# Patient Record
Sex: Female | Born: 1970 | Race: White | Hispanic: No | Marital: Single | Smoking: Never smoker
Health system: Southern US, Community
[De-identification: ages and names within clinical notes are randomized; demographics above are authoritative.]

## PROBLEM LIST (undated history)

## (undated) DIAGNOSIS — I10 Essential (primary) hypertension: Secondary | ICD-10-CM

## (undated) DIAGNOSIS — K219 Gastro-esophageal reflux disease without esophagitis: Secondary | ICD-10-CM

## (undated) DIAGNOSIS — E119 Type 2 diabetes mellitus without complications: Secondary | ICD-10-CM

## (undated) DIAGNOSIS — E1169 Type 2 diabetes mellitus with other specified complication: Secondary | ICD-10-CM

## (undated) DIAGNOSIS — F339 Major depressive disorder, recurrent, unspecified: Secondary | ICD-10-CM

## (undated) HISTORY — PX: ANKLE SURGERY: SHX546

## (undated) HISTORY — PX: TUBAL LIGATION: SHX77

## (undated) HISTORY — PX: WRIST SURGERY: SHX841

---

## 2016-05-17 ENCOUNTER — Ambulatory Visit: Attending: Family Medicine

## 2016-05-17 ENCOUNTER — Ambulatory Visit: Admit: 2016-05-17 | Discharge: 2016-05-17 | Payer: BLUE CROSS/BLUE SHIELD | Attending: Family Medicine

## 2016-05-17 ENCOUNTER — Telehealth

## 2016-05-17 DIAGNOSIS — R350 Frequency of micturition: Secondary | ICD-10-CM

## 2016-05-17 LAB — AMB POC COMPLETE CBC,AUTOMATED ENTER
ABS. GRANS (POC): 4 10*3/uL (ref 1.5–6.8)
ABS. LYMPHS (POC): 1.7 10*3/uL (ref 1.2–3.2)
ABS. MONOS (POC): 0.2 10*3/uL — AB (ref 0.3–0.8)
GRANULOCYTES (POC): 65.2 % (ref 43.0–76.0)
HCT (POC): 36.8 % (ref 35–50)
HGB (POC): 12 g/dL (ref 11–16.5)
LYMPHOCYTES (POC): 30.2 % (ref 17.0–48.0)
MCH (POC): 26.1 pg — AB (ref 26.5–33.5)
MCHC (POC): 32.8 g/dL (ref 31.5–35)
MCV (POC): 80 fL (ref 80–97)
MONOCYTES (POC): 4.6 % (ref 4.0–10.0)
MPV (POC): 7.4 fL (ref 6.5–11.0)
PLATELET (POC): 257 10*3/uL (ref 150–450)
RBC (POC): 4.61 10*6/uL (ref 3.8–5.8)
RDW (POC): 14.6 % (ref 10.0–15.0)
WBC (POC): 5.9 10*3/uL (ref 3.5–10)

## 2016-05-17 LAB — AMB POC HEMOGLOBIN A1C: Hemoglobin A1c (POC): 11.1 %

## 2016-05-17 MED ORDER — HYDROCHLOROTHIAZIDE 25 MG TAB
25 mg | ORAL_TABLET | Freq: Every day | ORAL | 0 refills | Status: DC
Start: 2016-05-17 — End: 2016-08-31

## 2016-05-17 MED ORDER — METFORMIN SR 500 MG 24 HR TABLET
500 mg | ORAL_TABLET | Freq: Every day | ORAL | 1 refills | Status: DC
Start: 2016-05-17 — End: 2016-08-31

## 2016-05-17 MED ORDER — DULOXETINE 60 MG CAP, DELAYED RELEASE
60 mg | ORAL_CAPSULE | Freq: Every day | ORAL | 0 refills | Status: DC
Start: 2016-05-17 — End: 2016-08-31

## 2016-05-17 MED ORDER — METOPROLOL TARTRATE 25 MG TAB
25 mg | ORAL_TABLET | Freq: Two times a day (BID) | ORAL | 0 refills | Status: DC
Start: 2016-05-17 — End: 2016-08-31

## 2016-05-17 MED ORDER — OMEPRAZOLE 20 MG TAB, DELAYED RELEASE
20 mg | ORAL_TABLET | Freq: Every day | ORAL | 0 refills | Status: DC
Start: 2016-05-17 — End: 2016-08-31

## 2016-05-17 NOTE — Assessment & Plan Note (Signed)
Problem and/or Symptoms are new to provider. Will have patient follow up as directed and make the following plan for further evaluation and/or treatlment:  Stable: Continue with Cymbalta 60 mg daily for now

## 2016-05-17 NOTE — Patient Instructions (Signed)
[address] [facility] is a [facility] which is a comprehensive non-surgical option for a structured approach to weight loss, including personalized instruction regarding diet and exercise with regular follow up. The program is usually 12 weeks long and can be contacted directly at: [phone]  I am checking one or more labs today related to your health concerns and if any results require a change in your treatment regimen you will be notified right away.  Continue taking all the medications on this list as directed; this list is current and please discontinue any medications not on this list. Please call the office or use "My Chart" online to request medication refills prior to running out.  It was a pleasure to see and care for you this visit; I look forward to seeing you next time.

## 2016-05-17 NOTE — Assessment & Plan Note (Signed)
Problem and/or Symptoms are new to provider. Will have patient follow up as directed and make the following plan for further evaluation and/or treatlment:  Stable: Continue with OTC meds as needed

## 2016-05-17 NOTE — Progress Notes (Signed)
[name] MD  [facility]  [address]  [place], [place] [address]  [phone]  Visit Date  05/17/2016    Patient Info  Kim Wallace  45 y.o.female  DOB: [birth year]  MRN: [id number]    Subjective    Chief Complaint   Patient presents with   ??? Hypertension   ??? Establish Care       HPI    45 yo CF here establish care as new patient and to discuss chronic health problems including: Allergic rhinitis, elevated BMI, depression with anxiety, acid reflux, and hypertension.  Patient feels that her current problems are well-controlled on her current regimen and has no acute complaints or concerns today except for chronic fatigue/malaise, urinary frequency, and strong family history of type 2 DM.     Review of Systems   Constitutional: Positive for fatigue. Negative for fever.   HENT: Negative for congestion and sore throat.    Eyes: Negative for pain and visual disturbance.   Respiratory: Negative for cough and shortness of breath.    Cardiovascular: Negative for chest pain and palpitations.   Gastrointestinal: Negative for abdominal pain, constipation, diarrhea and nausea.   Endocrine: Positive for polyuria. Negative for polydipsia and polyphagia.   Genitourinary: Positive for frequency. Negative for difficulty urinating and menstrual problem.   Musculoskeletal: Negative for arthralgias and myalgias.   Skin: Negative for color change and rash.   Allergic/Immunologic: Negative for environmental allergies and food allergies.   Neurological: Negative for weakness and headaches.   Hematological: Does not bruise/bleed easily.   Psychiatric/Behavioral: Negative for dysphoric mood and sleep disturbance.       Objective    Vitals:    05/17/16 1111   BP: 120/80   Pulse: 64   Temp: 99.1 ??F (37.3 ??C)   TempSrc: Oral   Weight: 241 lb (109.3 kg)   Height: 5' 7.5" (1.715 m)     Body mass index is 37.19 kg/(m^2).    Physical Exam   Constitutional: She appears well-developed and well-nourished.   HENT:    Head: Normocephalic and atraumatic.   Eyes: Conjunctivae and EOM are normal.   Neck: Normal range of motion and phonation normal.   Cardiovascular: Normal rate, regular rhythm, normal heart sounds and intact distal pulses.    Pulmonary/Chest: Effort normal and breath sounds normal. No respiratory distress.   Abdominal: Soft. Normal appearance.   Musculoskeletal: She exhibits no edema or deformity.   Neurological: She is alert. She displays no tremor. Coordination and gait normal.   Skin: Skin is dry. No rash noted.   Psychiatric: She has a normal mood and affect. Her behavior is normal.   Vitals reviewed.      Assessment/Plan    Problem List Items Addressed This Visit        Circulatory    HTN (hypertension)     Problem and/or Symptoms are new to provider. Will have patient follow up as directed and make the following plan for further evaluation and/or treatlment:  Stable: Continue with metoprolol and hydrochlorothiazide for now         Relevant Medications    hydroCHLOROthiazide (HYDRODIURIL) 25 mg tablet    metoprolol tartrate (LOPRESSOR) 25 mg tablet    Other Relevant Orders    METABOLIC PANEL, COMPREHENSIVE       Digestive    GERD (gastroesophageal reflux disease)     Problem and/or Symptoms are new to provider. Will have patient follow up as directed and make the following  plan for further evaluation and/or treatlment:  Stable: Continue with Prilosec 20 mg daily for now         Relevant Medications    Omeprazole delayed release (PRILOSEC D/R) 20 mg tablet       Respiratory    AR (allergic rhinitis)     Problem and/or Symptoms are new to provider. Will have patient follow up as directed and make the following plan for further evaluation and/or treatlment:  Stable: Continue with OTC meds as needed            Other    Annual physical exam     Problem not addressed today but diagnosis added to order future lab.           Relevant Orders    AMB POC COMPLETE CBC,AUTOMATED ENTER (Completed)    LIPID PANEL     BMI 37.0-37.9, adult     Discussed with patient proper diet and exercise to include: correct percentage of protein, fat, and carbohydrates for each meal with the focus on a low carbohydrate diet; also regular cardiovascular and strength training exercise most days of the week. All questions answered and will follow up weight and adherence to lifestyle modifications at next visit.  Also gave patient contact information for the physician lead weight loss program through [facility].         Relevant Medications    hydroCHLOROthiazide (HYDRODIURIL) 25 mg tablet    Depression with anxiety     Problem and/or Symptoms are new to provider. Will have patient follow up as directed and make the following plan for further evaluation and/or treatlment:  Stable: Continue with Cymbalta 60 mg daily for now         Relevant Medications    DULoxetine (CYMBALTA) 60 mg capsule      Other Visit Diagnoses     Urinary frequency    -  Primary    New problem: Along with chronic fatigue and strong family history of type 2 diabetes, will check A1c today to further evaluate for new diagnosis.    Relevant Orders    AMB POC HEMOGLOBIN A1C (Completed)    Malaise and fatigue        Relevant Medications    DULoxetine (CYMBALTA) 60 mg capsule    Other Relevant Orders    AMB POC HEMOGLOBIN A1C (Completed)    VITAMIN D, 25 HYDROXY    TSH 3RD GENERATION    Family history of diabetes mellitus (DM)        Relevant Orders    AMB POC HEMOGLOBIN A1C (Completed)    PR MOST RECENT HEMOGLOBIN A1C LEVEL > 9.0%          The following portions of the patient's history were reviewed and updated as appropriate: problem list, current medications, past medical history, past surgical history, past social history, family history, and allergies.    Patient Active Problem List   Diagnosis Code   ??? BMI 37.0-37.9, adult Z68.37   ??? HTN (hypertension) I10   ??? GERD (gastroesophageal reflux disease) K21.9   ??? Depression with anxiety F41.8    ??? Annual physical exam Z00.00   ??? Gastroparesis K31.84   ??? AR (allergic rhinitis) J30.9       Current Outpatient Prescriptions:   ???  DULoxetine (CYMBALTA) 60 mg capsule, Take 1 Cap by mouth daily., Disp: 92 Cap, Rfl: 0  ???  hydroCHLOROthiazide (HYDRODIURIL) 25 mg tablet, Take 1 Tab by mouth daily., Disp: 92 Tab,  Rfl: 0  ???  metoprolol tartrate (LOPRESSOR) 25 mg tablet, Take 1 Tab by mouth two (2) times a day., Disp: 184 Tab, Rfl: 0  ???  Omeprazole delayed release (PRILOSEC D/R) 20 mg tablet, Take 1 Tab by mouth daily., Disp: 92 Tab, Rfl: 0  Past Medical History:   Diagnosis Date   ??? Acid reflux    ??? Anxiety    ??? Depression    ??? Hypertension    ??? Sleep apnea      Past Surgical History:   Procedure Laterality Date   ??? HX ANKLE FRACTURE TX      ankle repair (has pins and screws) left ankle    ??? HX PELVIC FRACTURE TX     ??? HX TUBAL LIGATION      20 years ago    ??? HX WRIST FRACTURE TX      patient ripped tendons and had it repaired. (Left wrist)      Social History     Social History   ??? Marital status: SINGLE     Spouse name: N/A   ??? Number of children: N/A   ??? Years of education: N/A     Occupational History   ??? Not on file.     Social History Main Topics   ??? Smoking status: Never Smoker   ??? Smokeless tobacco: Not on file   ??? Alcohol use No   ??? Drug use: Not on file   ??? Sexual activity: Not on file     Other Topics Concern   ??? Not on file     Social History Narrative   ??? No narrative on file     Family History   Problem Relation Age of Onset   ??? Heart Disease Mother    ??? Diabetes Mother    ??? Hypertension Mother    ??? Diabetes Father    ??? Hypertension Father    ??? Heart Disease Father    ??? Migraines Father    ??? High Cholesterol Father    ??? Diabetes Sister    ??? Diabetes Sister      Allergies   Allergen Reactions   ??? Sulfa (Sulfonamide Antibiotics) Rash       This note will not be viewable in MyChart.

## 2016-05-17 NOTE — Assessment & Plan Note (Signed)
Problem not addressed today but diagnosis added to order future lab.

## 2016-05-17 NOTE — Assessment & Plan Note (Signed)
Problem and/or Symptoms are new to provider. Will have patient follow up as directed and make the following plan for further evaluation and/or treatlment:  Stable: Continue with metoprolol and hydrochlorothiazide for now

## 2016-05-17 NOTE — Telephone Encounter (Signed)
Please notify patient that she has a new diagnosis of type 2 diabetes with a recent A1c of 11.  I am sending a prescription for her to start a new medication called metformin, gradually increasing the dose over the next several weeks.  We will plan to recheck her A1c at next scheduled office visit.

## 2016-05-17 NOTE — Assessment & Plan Note (Addendum)
Problem and/or Symptoms are new to provider. Will have patient follow up as directed and make the following plan for further evaluation and/or treatlment:  Stable: Continue with Prilosec 20 mg daily for now

## 2016-05-17 NOTE — Assessment & Plan Note (Signed)
Discussed with patient proper diet and exercise to include: correct percentage of protein, fat, and carbohydrates for each meal with the focus on a low carbohydrate diet; also regular cardiovascular and strength training exercise most days of the week. All questions answered and will follow up weight and adherence to lifestyle modifications at next visit.  Also gave patient contact information for the [facility].

## 2016-05-18 LAB — METABOLIC PANEL, COMPREHENSIVE
A-G Ratio: 1.8 (ref 1.2–2.2)
ALT (SGPT): 27 IU/L (ref 0–32)
AST (SGOT): 18 IU/L (ref 0–40)
Albumin: 4.4 g/dL (ref 3.5–5.5)
Alk. phosphatase: 122 IU/L — ABNORMAL HIGH (ref 39–117)
BUN/Creatinine ratio: 13 (ref 9–23)
BUN: 11 mg/dL (ref 6–24)
Bilirubin, total: 0.6 mg/dL (ref 0.0–1.2)
CO2: 22 mmol/L (ref 18–29)
Calcium: 9.7 mg/dL (ref 8.7–10.2)
Chloride: 96 mmol/L (ref 96–106)
Creatinine: 0.88 mg/dL (ref 0.57–1.00)
GFR est AA: 92 mL/min/{1.73_m2} (ref >59)
GFR est non-AA: 80 mL/min/{1.73_m2} (ref >59)
GLOBULIN, TOTAL: 2.5 g/dL (ref 1.5–4.5)
Glucose: 279 mg/dL — ABNORMAL HIGH (ref 65–99)
Potassium: 4.4 mmol/L (ref 3.5–5.2)
Protein, total: 6.9 g/dL (ref 6.0–8.5)
Sodium: 138 mmol/L (ref 134–144)

## 2016-05-18 LAB — VITAMIN D, 25 HYDROXY: VITAMIN D, 25-HYDROXY: 20.6 ng/mL — ABNORMAL LOW (ref 30.0–100.0)

## 2016-05-18 LAB — LIPID PANEL
Cholesterol, total: 175 mg/dL (ref 100–199)
HDL Cholesterol: 23 mg/dL — ABNORMAL LOW (ref >39)
Triglyceride: 446 mg/dL — ABNORMAL HIGH (ref 0–149)

## 2016-05-18 LAB — TSH 3RD GENERATION: TSH: 1.83 u[IU]/mL (ref 0.450–4.500)

## 2016-05-18 MED ORDER — CHOLECALCIFEROL (VITAMIN D3) 5,000 UNIT TABLET
ORAL_TABLET | Freq: Every day | ORAL | 1 refills | Status: DC
Start: 2016-05-18 — End: 2016-08-31

## 2016-05-18 MED ORDER — ROSUVASTATIN 10 MG TAB
10 mg | ORAL_TABLET | Freq: Every evening | ORAL | 1 refills | Status: DC
Start: 2016-05-18 — End: 2016-08-31

## 2016-05-18 NOTE — Telephone Encounter (Signed)
Spoke to patient and told patient her lab results as well as recommendations as per Dr.Peters. Verbalized understanding

## 2016-05-18 NOTE — Telephone Encounter (Signed)
Had not provided the patient's other lab results as they did not return yet.  The rest of them just resulted this morning.  Also, please remind her to sign up for MyChart so she will have access to her current and future labs.    Please inform patient that the recently checked Vitamin D level returned lower than normal range which could be contributing to adverse effects such as chronic fatigue. I am therefore sending a new prescription to take for the next 6 months to replenish the Vitamin D; will plan to recheck the level at that time.    Also her cholesterol panel showed quite elevated triglycerides so would like to start her on a glycerol lowering medication to decrease her overall cardiovascular risk.    Thyroid hormone, kidney function, electrolytes, and blood levels are normal.

## 2016-05-18 NOTE — Telephone Encounter (Signed)
Spoke to patient and told patient her A1C results as well as recommendations as per Dr. Noe GensPeters. Verbalized understanding. Patient wants to know her other results

## 2016-05-18 NOTE — Telephone Encounter (Signed)
Left voicemail as per HIPAA for pt to return my call back

## 2016-08-23 ENCOUNTER — Encounter: Payer: BLUE CROSS/BLUE SHIELD | Attending: Family Medicine

## 2016-08-31 ENCOUNTER — Ambulatory Visit: Admit: 2016-08-31 | Discharge: 2016-08-31 | Payer: BLUE CROSS/BLUE SHIELD | Attending: Family Medicine

## 2016-08-31 DIAGNOSIS — Z0000 Encounter for general adult medical examination without abnormal findings: Secondary | ICD-10-CM

## 2016-08-31 LAB — AMB POC HEMOGLOBIN A1C: Hemoglobin A1c (POC): 6.3 %

## 2016-08-31 MED ORDER — METOPROLOL TARTRATE 25 MG TAB
25 mg | ORAL_TABLET | Freq: Two times a day (BID) | ORAL | 1 refills | Status: DC
Start: 2016-08-31 — End: 2017-01-08

## 2016-08-31 MED ORDER — OMEPRAZOLE 20 MG TAB, DELAYED RELEASE
20 mg | ORAL_TABLET | Freq: Every day | ORAL | 1 refills | Status: DC
Start: 2016-08-31 — End: 2017-02-28

## 2016-08-31 MED ORDER — HYDROCHLOROTHIAZIDE 25 MG TAB
25 mg | ORAL_TABLET | Freq: Every day | ORAL | 1 refills | Status: DC
Start: 2016-08-31 — End: 2017-02-28

## 2016-08-31 MED ORDER — GLYCERIN-MINERAL OIL-LANOLIN TOPICAL CREAM
CUTANEOUS | 5 refills | Status: AC
Start: 2016-08-31 — End: ?

## 2016-08-31 MED ORDER — ROSUVASTATIN 10 MG TAB
10 mg | ORAL_TABLET | Freq: Every evening | ORAL | 1 refills | Status: DC
Start: 2016-08-31 — End: 2017-01-08

## 2016-08-31 MED ORDER — DULOXETINE 60 MG CAP, DELAYED RELEASE
60 mg | ORAL_CAPSULE | Freq: Every day | ORAL | 1 refills | Status: DC
Start: 2016-08-31 — End: 2017-02-28

## 2016-08-31 MED ORDER — BETAMETHASONE VALERATE 0.1 % OINTMENT
0.1 % | Freq: Every day | CUTANEOUS | 0 refills | Status: DC
Start: 2016-08-31 — End: 2017-02-28

## 2016-08-31 MED ORDER — METFORMIN SR 500 MG 24 HR TABLET
500 mg | ORAL_TABLET | Freq: Every day | ORAL | 1 refills | Status: DC
Start: 2016-08-31 — End: 2017-01-08

## 2016-08-31 MED ORDER — CHOLECALCIFEROL (VITAMIN D3) 5,000 UNIT TABLET
ORAL_TABLET | Freq: Every day | ORAL | 1 refills | Status: DC
Start: 2016-08-31 — End: 2017-01-08

## 2016-08-31 NOTE — Assessment & Plan Note (Signed)
Problem and/or Symptoms are new to provider. Will have patient follow up as directed and make the following plan for further evaluation and/or treatlment:  Discussed new diagnosis with patient including conservative management with thick emollient/cream and prescription given for Eucerin plus.  Also prescribing medication and moderate potency steroid with betamethasone cream to be applied twice daily as needed

## 2016-08-31 NOTE — Assessment & Plan Note (Signed)
Problem and/or Symptoms are new to provider. Will have patient follow up as directed and make the following plan for further evaluation and/or treatlment:  Discussed with patient that neuropathy will hopefully improve as she continues to have better control of her blood sugars.  Offered patient a trial of neuropathy medication such as gabapentin but she declines at this time due to concern for adverse side effects and stating that her symptoms of neuropathy are not overly severe at this time.

## 2016-08-31 NOTE — Assessment & Plan Note (Signed)
Problem and/or Symptoms are currently stable and/or improving on current treatment plan.  Will continue and have patient follow up as directed.

## 2016-08-31 NOTE — Progress Notes (Signed)
Park Breed MD  [facility]  [address] [place] [address]  [phone]  Visit Date  08/31/2016    Patient Info  Kim Wallace  45 y.o.female  DOB: 08-10-1971  MRN: [id number]    Subjective    Chief Complaint   Patient presents with   ??? Skin Problem     ?'s Cirrhosis L elbow x 1 mo.   ??? Vitamin D Deficiency     Pt said she never started Rx as there wasn't a Rx at the Pharmacy for her   ??? Physical     45 y.o. female presents for an evaluation of chronic health conditions, lab work and/or review, medication prescriptions and/or refills, and annual physical.     Vaccinations (most recent date)  Pneumovax: Will get today  Prevnar: Never  Influenza (Yearly): Pt will get today  Tetanus/Pertussis: Unknown   Shingles: Never    Cancer Screening  Colonoscopy (Every 10 years): Never  Mammogram (Yearly): Per pt had done at [facility] 2 months ago.  Pap/HPV Status (Every 3-5 years): Per pt last Pap years ago, WNL.  HPV - Never. Referring to Ob/Gyn today    Disease Screening  Cholesterol (Every 5 years): 05/17/16  Diabetes (Yearly): 05/17/16  Glaucoma/Retinopathy (Yearly): Wears glasses for reading, last eye exam years ago.  Smoking (Yearly): Never  Alcohol Use (Yearly): Never  Illicit Drug Use (Yearly): No  STD's (Yearly): Never    High Risk  EKG (Once): Yes  Hepatitis B Vaccine: Yes  HIV Screening (Yearly): Unknown    PHQ over the last two weeks 05/17/2016   PHQ Not Done Active Diagnosis of Depression or Bipolar Disorder   Little interest or pleasure in doing things Nearly every day   Feeling down, depressed or hopeless Several days   Total Score PHQ 2 4   Trouble falling or staying asleep, or sleeping too much Nearly every day   Feeling tired or having little energy Nearly every day   Poor appetite or overeating More than half the days   Feeling bad about yourself - or that you are a failure or have let yourself or your family down Several days    Trouble concentrating on things such as school, work, reading or watching TV Nearly every day   Moving or speaking so slowly that other people could have noticed; or the opposite being so fidgety that others notice Not at all   Thoughts of being better off dead, or hurting yourself in some way Several days   PHQ 9 Score 17   How difficult have these problems made it for you to do your work, take care of your home and get along with others Somewhat difficult     Other Providers Seen  Patient Care Team:  Carney Living, MD as PCP - General (Family Practice)    Risk Factors  Risk factors (depression, fall risk, smoking, poor nutrition, physical inactivity, obesity, etc) were identified and appropriate referrals were made as below. If no referrals were made then no risk factors as listed were identified or there is already a treatment plan in place to address said risk factors.        HPI 45 year old Caucasian female also here for follow-up new diagnosis of type 2 diabetes, concern for numbness/burning/tingling bilateral feet for the past 6+ months, and also follow-up on several chronic health problems including: Elevated BMI, depression with anxiety, acid reflux, hyperlipidemia diagnosed at last office visit, hypertension, vitamin D deficiency,  and also has one other concern of possible psoriasis on her left elbow as there is a strong family history for this and she is having typical symptoms including thickened whitish inflamed skin.    Review of Systems   Constitutional: Positive for fatigue. Negative for fever.   HENT: Negative for congestion and sore throat.    Eyes: Negative for pain and visual disturbance.   Respiratory: Negative for cough and shortness of breath.    Cardiovascular: Negative for chest pain and palpitations.   Gastrointestinal: Positive for abdominal pain and nausea. Negative for constipation and diarrhea.   Endocrine: Negative for polydipsia and polyphagia.    Genitourinary: Negative for difficulty urinating and menstrual problem.   Musculoskeletal: Positive for arthralgias. Negative for myalgias.   Skin: Positive for color change and rash.   Allergic/Immunologic: Positive for environmental allergies. Negative for food allergies.   Neurological: Negative for weakness and headaches.   Hematological: Does not bruise/bleed easily.   Psychiatric/Behavioral: Negative for dysphoric mood and sleep disturbance.       Objective    Vitals:    08/31/16 1014   BP: 114/86   Pulse: 67   SpO2: 95%   Weight: 239 lb 6 oz (108.6 kg)   Height: 5' 7.5" (1.715 m)     BP Readings from Last 3 Encounters:   08/31/16 114/86   05/17/16 120/80     Body mass index is 36.94 kg/(m^2).    Wt Readings from Last 3 Encounters:   08/31/16 239 lb 6 oz (108.6 kg)   05/17/16 241 lb (109.3 kg)       Physical Exam   Constitutional: She appears well-developed and well-nourished.   Morbid obesity   HENT:   Head: Normocephalic and atraumatic.   Eyes: Conjunctivae and EOM are normal.   Neck: Normal range of motion and phonation normal.   Cardiovascular: Normal rate.    Pulmonary/Chest: Effort normal. No respiratory distress.   Abdominal: Soft. Normal appearance.   Musculoskeletal: She exhibits no edema or deformity.   Neurological: She is alert. She displays no tremor. Coordination and gait normal.   Skin: Skin is dry. Rash noted.        Psychiatric: She has a normal mood and affect. Her behavior is normal.   Vitals reviewed.      Assessment/Plan    Problem List Items Addressed This Visit        [place] Disorders    Diabetic gastroparesis ([place])     Problem and/or Symptoms are new to provider. Will have patient follow up as directed and make the following plan for further evaluation and/or treatlment:  Stable: Patient states that the symptoms are bothersome but of actually improved somewhat since starting her metformin.  Declines medication at this time due to concern for adverse side effects.          Relevant Medications    rosuvastatin (CRESTOR) 10 mg tablet    DULoxetine (CYMBALTA) 60 mg capsule    hydroCHLOROthiazide (HYDRODIURIL) 25 mg tablet    metoprolol tartrate (LOPRESSOR) 25 mg tablet    Omeprazole delayed release (PRILOSEC D/R) 20 mg tablet    metFORMIN ER (GLUCOPHAGE XR) 500 mg tablet    Other Relevant Orders    REFERRAL TO GASTROENTEROLOGY    Diabetic neuropathy (HCC)     Problem and/or Symptoms are new to provider. Will have patient follow up as directed and make the following plan for further evaluation and/or treatlment:  Discussed with patient that neuropathy will hopefully  improve as she continues to have better control of her blood sugars.  Offered patient a trial of neuropathy medication such as gabapentin but she declines at this time due to concern for adverse side effects and stating that her symptoms of neuropathy are not overly severe at this time.         Relevant Medications    rosuvastatin (CRESTOR) 10 mg tablet    DULoxetine (CYMBALTA) 60 mg capsule    hydroCHLOROthiazide (HYDRODIURIL) 25 mg tablet    metoprolol tartrate (LOPRESSOR) 25 mg tablet    metFORMIN ER (GLUCOPHAGE XR) 500 mg tablet    Other Relevant Orders    REFERRAL TO GASTROENTEROLOGY    DM2 (diabetes mellitus, type 2) (HCC)     Problem and/or symptoms are currently not stable and/or worsening on current treatment plan. Will have patient follow up as directed and make the following changes for further evaluation and/or treatment:   Diagnoses made after lab work obtained prior office visit 3 months ago and patient has successfully begun regimen of metformin Exar 2000 mg daily.  Will reassess A1c today but per patient report her blood sugars are still ranging in the upper 100s to low 200s even while fasting.         Relevant Medications    rosuvastatin (CRESTOR) 10 mg tablet    hydroCHLOROthiazide (HYDRODIURIL) 25 mg tablet    metoprolol tartrate (LOPRESSOR) 25 mg tablet    metFORMIN ER (GLUCOPHAGE XR) 500 mg tablet     Other Relevant Orders    AMB POC HEMOGLOBIN A1C    REFERRAL TO GASTROENTEROLOGY       Other    Annual physical exam - Primary      Discussed ideal body weight and encouraged regular physical activity and healthy diet. Recommended routine preventative measures such as always wearing seatbelt, home safety measures such as improved traction for bathroom floor, and firearm safety. Counseled the patient regarding the rationale for the recommended immunizations and screenings and they are current and/or updated.             BMI 37.0-37.9, adult      Discussed with patient proper diet and exercise to include: correct percentage of protein, fat, and carbohydrates for each meal with the focus on a low carbohydrate diet; also regular cardiovascular and strength training exercise most days of the week. All questions answered and will follow up weight and adherence to lifestyle modifications at next visit.               Relevant Medications    hydroCHLOROthiazide (HYDRODIURIL) 25 mg tablet    metFORMIN ER (GLUCOPHAGE XR) 500 mg tablet    Other Relevant Orders    REFERRAL TO GASTROENTEROLOGY    Depression with anxiety      Problem and/or Symptoms are currently stable and/or improving on current treatment plan.  Will continue and have patient follow up as directed.               Relevant Medications    DULoxetine (CYMBALTA) 60 mg capsule    GERD (gastroesophageal reflux disease)      Problem and/or Symptoms are currently stable and/or improving on current treatment plan.  Will continue and have patient follow up as directed.               Relevant Medications    Omeprazole delayed release (PRILOSEC D/R) 20 mg tablet    HLD (hyperlipidemia)      Problem and/or Symptoms are new  to provider. Will have patient follow up as directed and make the following plan for further evaluation and/or treatlment:  Found after lab work obtained prior office visit 3 months ago.  Have  started patient on Crestor 10 mg nightly.  Will plan to recheck lipid panel in another 3 months             HTN (hypertension)      Problem and/or Symptoms are currently stable and/or improving on current treatment plan.  Will continue and have patient follow up as directed.               Relevant Medications    rosuvastatin (CRESTOR) 10 mg tablet    hydroCHLOROthiazide (HYDRODIURIL) 25 mg tablet    metoprolol tartrate (LOPRESSOR) 25 mg tablet    Psoriasis      Problem and/or Symptoms are new to provider. Will have patient follow up as directed and make the following plan for further evaluation and/or treatlment:  Discussed new diagnosis with patient including conservative management with thick emollient/cream and prescription given for Eucerin plus.  Also prescribing medication and moderate potency steroid with betamethasone cream to be applied twice daily as needed             Relevant Medications    betamethasone valerate (VALISONE) 0.1 % ointment    glycerin-mineral oil-lanolin (EUCERIN PLUS) topical cream    Vitamin D deficiency      Problem and/or symptoms are currently not stable and/or worsening on current treatment plan. Will have patient follow up as directed and make the following changes for further evaluation and/or treatment:   For some reason patient did not receive this medication from her pharmacy that was sent along with her other medications 3 months ago.  Recent prescription today and will plan to recheck levels in the next 3-6 months.             Relevant Medications    Omeprazole delayed release (PRILOSEC D/R) 20 mg tablet      Other Visit Diagnoses     Well woman exam with routine gynecological exam        Relevant Orders    REFERRAL TO OBSTETRICS AND GYNECOLOGY          Orders Placed This Encounter   ??? REFERRAL TO OBSTETRICS AND GYNECOLOGY     Referral Priority:   Routine     Referral Type:   Consultation     Referral Reason:   Specialty Services Required      Referred to Provider:   Aldean Baker, MD     Requested Specialty:   Obstetrics & Gynecology   ??? GASTROENTEROLOGY     Referral Priority:   Routine     Referral Type:   Consultation     Referral Reason:   Specialty Services Required   ??? AMB POC HEMOGLOBIN A1C   ??? rosuvastatin (CRESTOR) 10 mg tablet     Sig: Take 1 Tab by mouth nightly.     Dispense:  92 Tab     Refill:  1   ??? cholecalciferol, VITAMIN D3, (VITAMIN D3) 5,000 unit tab tablet     Sig: Take 2 Tabs by mouth daily.     Dispense:  184 Tab     Refill:  1   ??? DULoxetine (CYMBALTA) 60 mg capsule     Sig: Take 1 Cap by mouth daily.     Dispense:  92 Cap     Refill:  1   ???  hydroCHLOROthiazide (HYDRODIURIL) 25 mg tablet     Sig: Take 1 Tab by mouth daily.     Dispense:  92 Tab     Refill:  1   ??? metoprolol tartrate (LOPRESSOR) 25 mg tablet     Sig: Take 1 Tab by mouth two (2) times a day.     Dispense:  184 Tab     Refill:  1   ??? Omeprazole delayed release (PRILOSEC D/R) 20 mg tablet     Sig: Take 1 Tab by mouth daily.     Dispense:  92 Tab     Refill:  1   ??? metFORMIN ER (GLUCOPHAGE XR) 500 mg tablet     Sig: Take 4 Tabs by mouth daily (with dinner).     Dispense:  368 Tab     Refill:  1   ??? betamethasone valerate (VALISONE) 0.1 % ointment     Sig: Apply  to affected area daily.     Dispense:  15 g     Refill:  0   ??? glycerin-mineral oil-lanolin (EUCERIN PLUS) topical cream     Sig: Apply to affected skin 2 to 3 times daily & after bathing     Dispense:  113 g     Refill:  5       The following portions of the patient's history were reviewed and updated as appropriate: problem list, current medications, past medical history, past surgical history, past social history, family history, and allergies.    Patient Active Problem List   Diagnosis Code   ??? BMI 37.0-37.9, adult Z68.37   ??? HTN (hypertension) I10   ??? GERD (gastroesophageal reflux disease) K21.9   ??? Depression with anxiety F41.8   ??? Annual physical exam Z00.00   ??? AR (allergic rhinitis) J30.9    ??? DM2 (diabetes mellitus, type 2) (HCC) E11.9   ??? Vitamin D deficiency E55.9   ??? HLD (hyperlipidemia) E78.5   ??? Psoriasis L40.9   ??? Diabetic neuropathy (HCC) E11.40   ??? Diabetic gastroparesis (HCC) E11.43, K31.84     Current Outpatient Prescriptions   Medication Sig   ??? rosuvastatin (CRESTOR) 10 mg tablet Take 1 Tab by mouth nightly.   ??? cholecalciferol, VITAMIN D3, (VITAMIN D3) 5,000 unit tab tablet Take 2 Tabs by mouth daily.   ??? DULoxetine (CYMBALTA) 60 mg capsule Take 1 Cap by mouth daily.   ??? hydroCHLOROthiazide (HYDRODIURIL) 25 mg tablet Take 1 Tab by mouth daily.   ??? metoprolol tartrate (LOPRESSOR) 25 mg tablet Take 1 Tab by mouth two (2) times a day.   ??? Omeprazole delayed release (PRILOSEC D/R) 20 mg tablet Take 1 Tab by mouth daily.   ??? metFORMIN ER (GLUCOPHAGE XR) 500 mg tablet Take 4 Tabs by mouth daily (with dinner).   ??? betamethasone valerate (VALISONE) 0.1 % ointment Apply  to affected area daily.   ??? glycerin-mineral oil-lanolin (EUCERIN PLUS) topical cream Apply to affected skin 2 to 3 times daily & after bathing     No current facility-administered medications for this visit.      Past Medical History:   Diagnosis Date   ??? Acid reflux    ??? Anxiety    ??? Depression    ??? Hypertension    ??? Sleep apnea      Past Surgical History:   Procedure Laterality Date   ??? HX ANKLE FRACTURE TX      ankle repair (has pins and screws) left ankle    ???  HX PELVIC FRACTURE TX     ??? HX TUBAL LIGATION      20 years ago    ??? HX WRIST FRACTURE TX      patient ripped tendons and had it repaired. (Left wrist)       Social History     Social History   ??? Marital status: SINGLE     Spouse name: N/A   ??? Number of children: N/A   ??? Years of education: N/A     Occupational History   ??? Not on file.     Social History Main Topics   ??? Smoking status: Never Smoker   ??? Smokeless tobacco: Not on file   ??? Alcohol use No   ??? Drug use: Not on file   ??? Sexual activity: Not on file     Other Topics Concern   ??? Not on file      Social History Narrative     Family History   Problem Relation Age of Onset   ??? Heart Disease Mother    ??? Diabetes Mother    ??? Hypertension Mother    ??? Diabetes Father    ??? Hypertension Father    ??? Heart Disease Father    ??? Migraines Father    ??? High Cholesterol Father    ??? Diabetes Sister    ??? Diabetes Sister      Allergies   Allergen Reactions   ??? Sulfa (Sulfonamide Antibiotics) Rash       No visits with results within 3 Month(s) from this visit.  Latest known visit with results is:    Office Visit on 05/17/2016   Component Date Value Ref Range Status   ??? Hemoglobin A1c (POC) 05/17/2016 11.1  % Final   ??? WBC (POC) 05/17/2016 5.9  3.5 - 10 10^3/ul Final   ??? RBC (POC) 05/17/2016 4.61  3.8 - 5.8 10^6/ul Final   ??? HGB (POC) 05/17/2016 12.0  11 - 16.5 g/dL Final   ??? HCT (POC) 05/17/2016 36.8  35 - 50 % Final   ??? MCV (POC) 05/17/2016 80  80 - 97 fL Final   ??? MCH (POC) 05/17/2016 26.1* 26.5 - 33.5 pg Final   ??? MCHC (POC) 05/17/2016 32.8  31.5 - 35 g/dL Final   ??? RDW (POC) 05/17/2016 14.6  10.0 - 15.0 % Final   ??? PLATELET (POC) 05/17/2016 257  150 - 450 10^3/ul Final   ??? MPV (POC) 05/17/2016 7.4  6.5 - 11.0 fL Final   ??? LYMPHOCYTES (POC) 05/17/2016 30.2  17.0 - 48.0 % Final   ??? ABS. LYMPHS (POC) 05/17/2016 1.70  1.2 - 3.2 10^3/ul Final   ??? MONOCYTES (POC) 05/17/2016 4.6  4.0 - 10.0 % Final   ??? ABS. MONOS (POC) 05/17/2016 0.20* 0.3 - 0.8 10^3/ul Final   ??? GRANULOCYTES (POC) 05/17/2016 65.2  43.0 - 76.0 % Final   ??? ABS. GRANS (POC) 05/17/2016 4.00  1.5 - 6.8 10^3/ul Final   ??? Cholesterol, total 05/17/2016 175  100 - 199 mg/dL Final   ??? Triglyceride 05/17/2016 446* 0 - 149 mg/dL Final   ??? HDL Cholesterol 05/17/2016 23* >39 mg/dL Final   ??? VLDL, calculated 05/17/2016 Comment  5 - 40 mg/dL Final    Comment: The calculation for the VLDL cholesterol is not valid when  triglyceride level is >400 mg/dL.     ??? LDL, calculated 05/17/2016 Comment  0 - 99 mg/dL Final     Comment: Triglyceride result  indicated is too high for an accurate LDL  cholesterol estimation.     ??? VITAMIN D, 25-HYDROXY 05/17/2016 20.6* 30.0 - 100.0 ng/mL Final    Comment: Vitamin D deficiency has been defined by the [place] practice guideline as a  level of serum 25-OH vitamin D less than 20 ng/mL (1,2).  The Endocrine Society went on to further define vitamin D  insufficiency as a level between 21 and 29 ng/mL (2).  1. IOM (Institute of Medicine). 2010. Dietary reference     intakes for calcium and D. [place]: The     [employer].  2. Holick MF, Binkley NC, Bischoff-Ferrari HA, et al.     Evaluation, treatment, and prevention of vitamin D     deficiency: an Endocrine Society clinical practice     guideline. JCEM. 2011 Jul; 96(7):1911-30.     ??? TSH 05/17/2016 1.830  0.450 - 4.500 uIU/mL Final   ??? Glucose 05/17/2016 279* 65 - 99 mg/dL Final   ??? BUN 05/17/2016 11  6 - 24 mg/dL Final   ??? Creatinine 05/17/2016 0.88  0.57 - 1.00 mg/dL Final   ??? GFR est non-AA 05/17/2016 80  >59 mL/min/1.73 Final   ??? GFR est AA 05/17/2016 92  >59 mL/min/1.73 Final   ??? BUN/Creatinine ratio 05/17/2016 13  9 - 23 Final   ??? Sodium 05/17/2016 138  134 - 144 mmol/L Final   ??? Potassium 05/17/2016 4.4  3.5 - 5.2 mmol/L Final   ??? Chloride 05/17/2016 96  96 - 106 mmol/L Final   ??? CO2 05/17/2016 22  18 - 29 mmol/L Final   ??? Calcium 05/17/2016 9.7  8.7 - 10.2 mg/dL Final   ??? Protein, total 05/17/2016 6.9  6.0 - 8.5 g/dL Final   ??? Albumin 05/17/2016 4.4  3.5 - 5.5 g/dL Final   ??? GLOBULIN, TOTAL 05/17/2016 2.5  1.5 - 4.5 g/dL Final   ??? A-G Ratio 05/17/2016 1.8  1.2 - 2.2 Final   ??? Bilirubin, total 05/17/2016 0.6  0.0 - 1.2 mg/dL Final   ??? Alk. phosphatase 05/17/2016 122* 39 - 117 IU/L Final   ??? AST (SGOT) 05/17/2016 18  0 - 40 IU/L Final   ??? ALT (SGPT) 05/17/2016 27  0 - 32 IU/L Final       This note will not be viewable in MyChart.

## 2016-08-31 NOTE — Assessment & Plan Note (Signed)
Discussed with patient proper diet and exercise to include: correct percentage of protein, fat, and carbohydrates for each meal with the focus on a low carbohydrate diet; also regular cardiovascular and strength training exercise most days of the week. All questions answered and will follow up weight and adherence to lifestyle modifications at next visit.

## 2016-08-31 NOTE — Assessment & Plan Note (Signed)
Problem and/or Symptoms are new to provider. Will have patient follow up as directed and make the following plan for further evaluation and/or treatlment:  Found after lab work obtained prior office visit 3 months ago.  Have started patient on Crestor 10 mg nightly.  Will plan to recheck lipid panel in another 3 months

## 2016-08-31 NOTE — Patient Instructions (Signed)
Continue taking all the medications on this list as directed; this list is current and please discontinue any medications not on this list. Please call the office or use "My Chart" online to request medication refills prior to running out.  I am checking one or more labs today related to your health concerns and if any results require a change in your treatment regimen you will be notified right away.  It was a pleasure to see and care for you this visit; I look forward to seeing you next time.

## 2016-08-31 NOTE — Telephone Encounter (Signed)
-----   Message from Renaldo Fiddler, MD sent at 08/31/2016 11:21 AM EDT -----  Please notify patient of normal lab result(s) and that there is no change needed to current treatment plan.

## 2016-08-31 NOTE — Addendum Note (Signed)
Addended by: Kem KaysJENSEN, Luwanna Brossman K on: 08/31/2016 11:53 AM      Modules accepted: Orders

## 2016-08-31 NOTE — Telephone Encounter (Signed)
L/M on pt's voice mail advising below note any ?'s to C/B.

## 2016-08-31 NOTE — Assessment & Plan Note (Signed)
Discussed ideal body weight and encouraged regular physical activity and healthy diet. Recommended routine preventative measures such as always wearing seatbelt, home safety measures such as improved traction for bathroom floor, and firearm safety. Counseled the patient regarding the rationale for the recommended immunizations and screenings and they are current and/or updated.

## 2016-08-31 NOTE — Assessment & Plan Note (Signed)
Problem and/or symptoms are currently not stable and/or worsening on current treatment plan. Will have patient follow up as directed and make the following changes for further evaluation and/or treatment:   For some reason patient did not receive this medication from her pharmacy that was sent along with her other medications 3 months ago.  Recent prescription today and will plan to recheck levels in the next 3-6 months.

## 2016-08-31 NOTE — Assessment & Plan Note (Addendum)
Problem and/or Symptoms are new to provider. Will have patient follow up as directed and make the following plan for further evaluation and/or treatlment:  Stable: Patient states that the symptoms are bothersome but of actually improved somewhat since starting her metformin.  Declines medication at this time due to concern for adverse side effects.

## 2016-08-31 NOTE — Assessment & Plan Note (Signed)
Problem and/or symptoms are currently not stable and/or worsening on current treatment plan. Will have patient follow up as directed and make the following changes for further evaluation and/or treatment:   Diagnoses made after lab work obtained prior office visit 3 months ago and patient has successfully begun regimen of metformin Exar 2000 mg daily.  Will reassess A1c today but per patient report her blood sugars are still ranging in the upper 100s to low 200s even while fasting.

## 2016-08-31 NOTE — Addendum Note (Signed)
Addended by: Kelton Pillar D on: 08/31/2016 11:14 AM      Modules accepted: Orders

## 2016-08-31 NOTE — Progress Notes (Signed)
Please notify patient of normal lab result(s) and that there is no change needed to current treatment plan.

## 2016-09-07 NOTE — Telephone Encounter (Addendum)
Spoke with pt advised her that the Cymbalta was faxed to the Pharmacy on 08/31/16 #92 w/1 refill.  Pt said that she had checked with them and told that she didn't have any refills.  Advised her that I would call and check with the Pharmacy and call in Rx if need be, pt expressed understanding.

## 2016-09-07 NOTE — Telephone Encounter (Signed)
Spoke with pt advised of below note she expressed understanding.

## 2016-09-07 NOTE — Telephone Encounter (Signed)
Pt calling for refills of Cymbalta, said she is leaving for out of town.

## 2016-09-07 NOTE — Telephone Encounter (Signed)
Spoke with Pharmacist at [facility], [place] confirmed that pt DOES indeed have refills of Cymbalta available, asked that they refill Rx as pt is requesting a refill, Pharmacist expressed understanding.

## 2016-09-25 ENCOUNTER — Ambulatory Visit: Admit: 2016-09-25 | Discharge: 2016-09-25 | Payer: BLUE CROSS/BLUE SHIELD | Attending: Obstetrics & Gynecology

## 2016-09-25 DIAGNOSIS — Z113 Encounter for screening for infections with a predominantly sexual mode of transmission: Secondary | ICD-10-CM
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
## 2016-09-25 NOTE — Assessment & Plan Note (Signed)
Pap + HPV

## 2016-09-25 NOTE — Progress Notes (Signed)
Pt here for annual exam. Needs pap smear.     LAST PAP:  > 5 years ago    [facility]    LMP:  Patient's last menstrual period was 09/16/2016 (exact date).    BIRTH CONTROL:  tubal ligation    Visit Vitals   ??? BP 162/90 (BP 1 Location: Left arm, BP Patient Position: Sitting)   ??? Ht 5\' 8"  (1.727 m)   ??? Wt 232 lb 6.4 oz (105.4 kg)   ??? BMI 35.34 kg/m2       Vernell Barrieryra L Kariel Skillman, NP  09/25/16  8:22 AM

## 2016-09-25 NOTE — Progress Notes (Signed)
I have reviewed the patient's visit today including history, exam and assessment by Kim Wallace, WHNP-BC.  I agree with treatment/plan as above.    Kim BurkeSamy Robert [name], MD  8:57 AM  09/25/16

## 2016-09-25 NOTE — Patient Instructions (Signed)
Thank you for coming today  We will send for your mammogram results  We recommend a diet high in vegetables, fruits, whole grains, low fat dairy, and lean meats such as baked chicken and fish. Limit the amount of red meat, sweets, and sugary beverages (such as soda and sweet tea). Exercise 30 minutes every day.  Return in 1 year for your next exam  We will call you with any abnormal results  Follow up with your family physician to have your blood pressure rechecked.

## 2016-09-25 NOTE — Assessment & Plan Note (Signed)
CT/GC/TV  RPR, HIV

## 2016-09-29 LAB — PAP IG, CT-NG-TV, APTIMA HPV AND RFX 16/18,45 (199315)
CHLAMYDIA, NUC. ACID AMP, 186134: NEGATIVE
GONOCOCCUS, NUC. ACID AMP, 186135: NEGATIVE
HPV Aptima: NEGATIVE
LABCORP 019018: 0
TRICH VAG BY NAA: NEGATIVE

## 2016-09-29 LAB — PAP IG, CT-NG-TV, APTIMA HPV AND RFX 16/18,45(183160,507805)
.: 0
Chlamydia, Nuc. Acid Amp: NEGATIVE
Gonococcus, Nuc. Acid Amp: NEGATIVE
HPV APTIMA: NEGATIVE
Trich vag by NAA: NEGATIVE

## 2016-09-30 LAB — HIV 1,2 AB CONFIRM
HIV-1 Ab: NEGATIVE
HIV-1 Ab: NEGATIVE
HIV-2 Ab: NEGATIVE
HIV-2 Ab: NEGATIVE

## 2016-09-30 LAB — HIV-1 RNA, QL
HIV 1 RNA QUALITATIVE, 083951: NEGATIVE
HIV 1 RNA QUALITATIVE: NEGATIVE

## 2016-09-30 LAB — RPR W/REFLEX TITER AND TREPONEMA ABS
RPR: NONREACTIVE
RPR: NONREACTIVE

## 2016-09-30 LAB — HIV 1/2 ANTIGEN/ANTIBODY, FOURTH GENERATION W/RFL

## 2016-09-30 LAB — HIV 1/2 AG/AB, 4TH GENERATION,W RFLX CONFIRM

## 2016-10-02 NOTE — Telephone Encounter (Signed)
Left voicemail message for pt to return call to review pap, HIV, RPR test results.

## 2016-10-03 NOTE — Telephone Encounter (Signed)
Spoke with pt and let her know pap and all STD screening was negative    Initial HIV screen was reactive but confirmatory test for HIV 1,2 antibodies and HIV-2 RNA were negative. Will plan to repeat with next annual exam.

## 2017-01-08 ENCOUNTER — Ambulatory Visit: Admit: 2017-01-08 | Discharge: 2017-01-08 | Payer: BLUE CROSS/BLUE SHIELD | Attending: Family Medicine

## 2017-01-08 DIAGNOSIS — N3000 Acute cystitis without hematuria: Secondary | ICD-10-CM

## 2017-01-08 LAB — AMB POC URINALYSIS DIP STICK AUTO W/ MICRO
Bilirubin (UA POC): NEGATIVE
Blood (UA POC): NEGATIVE
Glucose (UA POC): NEGATIVE
Ketones (UA POC): NEGATIVE
Nitrites (UA POC): NEGATIVE
Protein (UA POC): NEGATIVE
Specific gravity (UA POC): 1.02 (ref 1.001–1.035)
Urobilinogen (UA POC): 0.2 (ref 0.2–1)
pH (UA POC): 6.5 (ref 4.6–8.0)

## 2017-01-08 MED ORDER — ALBUTEROL SULFATE HFA 90 MCG/ACTUATION AEROSOL INHALER
90 mcg/actuation | Freq: Four times a day (QID) | RESPIRATORY_TRACT | 1 refills | Status: DC | PRN
Start: 2017-01-08 — End: 2017-09-12

## 2017-01-08 MED ORDER — CEFDINIR 300 MG CAP
300 mg | ORAL_CAPSULE | Freq: Two times a day (BID) | ORAL | 0 refills | Status: AC
Start: 2017-01-08 — End: 2017-01-18

## 2017-01-08 MED ORDER — HYDROCODONE-HOMATROPINE 5 MG-1.5 MG/5 ML (5 ML) ORAL SOLUTION
Freq: Four times a day (QID) | ORAL | 0 refills | Status: AC | PRN
Start: 2017-01-08 — End: 2017-02-07

## 2017-01-08 NOTE — Addendum Note (Signed)
Addended by: Kelton PillarJONES, Doaa Kendzierski D on: 01/08/2017 08:25 AM      Modules accepted: Orders

## 2017-01-08 NOTE — Assessment & Plan Note (Signed)
Problem and/or Symptoms are currently stable and/or improving on current treatment plan.  Will continue and have patient follow up as directed.

## 2017-01-08 NOTE — Assessment & Plan Note (Addendum)
Problem and/or Symptoms are new to provider. Will have patient follow up as directed and make the following plan for further evaluation and/or treatlment:  Not stable: Providing patient with albuterol inhaler to be used as needed
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
## 2017-01-08 NOTE — Assessment & Plan Note (Signed)
Discussed with patient proper diet and exercise to include: correct percentage of protein, fat, and carbohydrates for each meal with the focus on a low carbohydrate diet; also regular cardiovascular and strength training exercise most days of the week. All questions answered and will follow up weight and adherence to lifestyle modifications at next visit.

## 2017-01-08 NOTE — Patient Instructions (Signed)
Please finish the full course of antibiotic prescribed for you; not finishing the full course will increase the risk of developing an antibiotic resistant infection that will become more difficult to treat.  Continue taking all the medications on this list as directed; this list is current and please discontinue any medications not on this list. Please call the office or use "My Chart" online to request medication refills prior to running out.  It was a pleasure to see and care for you this visit; I look forward to seeing you next time.   Also please remember that we keep "Urgent Care" visit slots in reserve every day for any acute illness or injury. If you ever have an urgent issue please call our office rather than going to an Urgent Care and we should always be able to see you within 24 hours.

## 2017-01-11 LAB — CULTURE, URINE

## 2017-02-28 ENCOUNTER — Ambulatory Visit: Admit: 2017-02-28 | Discharge: 2017-02-28 | Payer: BLUE CROSS/BLUE SHIELD | Attending: Family Medicine

## 2017-02-28 DIAGNOSIS — J0140 Acute pansinusitis, unspecified: Secondary | ICD-10-CM

## 2017-02-28 LAB — AMB POC HEMOGLOBIN A1C: Hemoglobin A1c (POC): 6.7 % — AB (ref 4.8–5.7)

## 2017-02-28 MED ORDER — METFORMIN 500 MG TAB
500 mg | ORAL_TABLET | Freq: Every day | ORAL | 1 refills | Status: DC
Start: 2017-02-28 — End: 2017-09-12

## 2017-02-28 MED ORDER — DULOXETINE 60 MG CAP, DELAYED RELEASE
60 mg | ORAL_CAPSULE | Freq: Every day | ORAL | 1 refills | Status: DC
Start: 2017-02-28 — End: 2017-09-12

## 2017-02-28 MED ORDER — BETAMETHASONE VALERATE 0.1 % OINTMENT
0.1 % | Freq: Every day | CUTANEOUS | 1 refills | Status: DC
Start: 2017-02-28 — End: 2017-09-12

## 2017-02-28 MED ORDER — OMEPRAZOLE 20 MG CAP, DELAYED RELEASE
20 mg | ORAL_CAPSULE | Freq: Every day | ORAL | 1 refills | Status: AC
Start: 2017-02-28 — End: ?

## 2017-02-28 MED ORDER — HYDROCHLOROTHIAZIDE 25 MG TAB
25 mg | ORAL_TABLET | Freq: Every day | ORAL | 1 refills | Status: DC
Start: 2017-02-28 — End: 2017-09-12

## 2017-02-28 MED ORDER — OMEPRAZOLE 20 MG TAB, DELAYED RELEASE
20 mg | ORAL_TABLET | Freq: Every day | ORAL | 1 refills | Status: DC
Start: 2017-02-28 — End: 2017-09-12

## 2017-02-28 MED ORDER — AMOXICILLIN CLAVULANATE 875 MG-125 MG TAB
875-125 mg | ORAL_TABLET | Freq: Two times a day (BID) | ORAL | 0 refills | Status: AC
Start: 2017-02-28 — End: 2017-03-10

## 2017-02-28 NOTE — Assessment & Plan Note (Signed)
Problem and/or Symptoms are currently stable and/or improving on current treatment plan.  Will continue and have patient follow up as directed.

## 2017-02-28 NOTE — Patient Instructions (Signed)
I am checking one or more labs today related to your health concerns and if any results require a change in your treatment regimen you will be notified right away.  Continue taking all the medications on this list as directed; this list is current and please discontinue any medications not on this list. Please call the office or use "My Chart" online to request medication refills prior to running out.  It was a pleasure to see and care for you this visit; I look forward to seeing you next time.   Also please remember that we keep "Urgent Care" visit slots in reserve every day for any acute illness or injury. If you ever have an urgent issue please call our office rather than going to an Urgent Care and we should always be able to see you within 24 hours.

## 2017-02-28 NOTE — Addendum Note (Signed)
Addended by: Kelton PillarJONES, Randen Kauth D on: 02/28/2017 09:53 AM      Modules accepted: Orders

## 2017-02-28 NOTE — Telephone Encounter (Signed)
Pharmacist from [facility] on voice mail saying that the pt's Insurance doesn't cover Prilosec tablets but that they do cover the capsules.   She is wondering if Rx can be changed?    Per Dr. Noe GensPeters new Rx for Prilosec capsules faxed to Pharmacy.
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
## 2017-02-28 NOTE — Assessment & Plan Note (Signed)
Problem and/or symptoms are currently not stable and/or worsening on current treatment plan. Will have patient follow up as directed and make the following changes for further evaluation and/or treatment:   Patient states she is only resumed taking metformin Exar 500 mg 2 pills daily for the past month and was off the medication completely for the 2 months prior to that.  Will check A1c today but likely will be elevated since patient is only been on treatment for one third of the last 3 months.  We will follow-up A1c again at next scheduled office visit and encourage patient to continue with metformin at current regimen and to call if any adverse side effects rather than discontinuing the medication on her own.  Patient voices understanding

## 2017-03-01 LAB — LIPID PANEL
Cholesterol, total: 195 mg/dL (ref 100–199)
HDL Cholesterol: 34 mg/dL — ABNORMAL LOW (ref >39)
LDL, calculated: 116 mg/dL — ABNORMAL HIGH (ref 0–99)
Triglyceride: 226 mg/dL — ABNORMAL HIGH (ref 0–149)
VLDL, calculated: 45 mg/dL — ABNORMAL HIGH (ref 5–40)

## 2017-03-01 LAB — VITAMIN D, 25 HYDROXY: VITAMIN D, 25-HYDROXY: 16.7 ng/mL — ABNORMAL LOW (ref 30.0–100.0)

## 2017-03-01 LAB — METABOLIC PANEL, COMPREHENSIVE
A-G Ratio: 1.8 (ref 1.2–2.2)
ALT (SGPT): 14 IU/L (ref 0–32)
AST (SGOT): 12 IU/L (ref 0–40)
Albumin: 4.5 g/dL (ref 3.5–5.5)
Alk. phosphatase: 94 IU/L (ref 39–117)
BUN/Creatinine ratio: 16 (ref 9–23)
BUN: 16 mg/dL (ref 6–24)
Bilirubin, total: 0.6 mg/dL (ref 0.0–1.2)
CO2: 24 mmol/L (ref 18–29)
Calcium: 9.8 mg/dL (ref 8.7–10.2)
Chloride: 100 mmol/L (ref 96–106)
Creatinine: 0.99 mg/dL (ref 0.57–1.00)
GFR est AA: 80 mL/min/{1.73_m2} (ref >59)
GFR est non-AA: 69 mL/min/{1.73_m2} (ref >59)
GLOBULIN, TOTAL: 2.5 g/dL (ref 1.5–4.5)
Glucose: 126 mg/dL — ABNORMAL HIGH (ref 65–99)
Potassium: 4.2 mmol/L (ref 3.5–5.2)
Protein, total: 7 g/dL (ref 6.0–8.5)
Sodium: 141 mmol/L (ref 134–144)

## 2017-04-24 NOTE — Telephone Encounter (Signed)
Pt L/M on voice mail wanting to know if Dr. Noe GensPeters could refill her CPAP Supplies?  York SpanielSaid it's been since 2016 since she has gotten any supplies.

## 2017-04-25 NOTE — Telephone Encounter (Signed)
See note below.   Spoke with pt she said that [facility]  Said she contacted [facility] to get supplies and was told that she need a order.    Advise pt that Dr. Noe GensPeters doesn't usually write orders for CPAP Supplies and that she needs to contact the Dr that had written her order previously.  Pt expressed understanding.

## 2017-08-23 ENCOUNTER — Encounter

## 2017-08-30 ENCOUNTER — Encounter: Attending: Family Medicine

## 2017-09-12 ENCOUNTER — Encounter

## 2017-09-12 MED ORDER — OMEPRAZOLE 20 MG TAB, DELAYED RELEASE
20 mg | ORAL_TABLET | Freq: Every day | ORAL | 0 refills | Status: AC
Start: 2017-09-12 — End: ?

## 2017-09-12 MED ORDER — ALBUTEROL SULFATE HFA 90 MCG/ACTUATION AEROSOL INHALER
90 mcg/actuation | Freq: Four times a day (QID) | RESPIRATORY_TRACT | 0 refills | Status: AC | PRN
Start: 2017-09-12 — End: ?

## 2017-09-12 MED ORDER — HYDROCHLOROTHIAZIDE 25 MG TAB
25 mg | ORAL_TABLET | Freq: Every day | ORAL | 0 refills | Status: AC
Start: 2017-09-12 — End: ?

## 2017-09-12 MED ORDER — BETAMETHASONE VALERATE 0.1 % OINTMENT
0.1 % | Freq: Every day | CUTANEOUS | 0 refills | Status: AC
Start: 2017-09-12 — End: ?

## 2017-09-12 MED ORDER — DULOXETINE 60 MG CAP, DELAYED RELEASE
60 mg | ORAL_CAPSULE | Freq: Every day | ORAL | 0 refills | Status: AC
Start: 2017-09-12 — End: ?

## 2017-09-12 MED ORDER — METFORMIN 500 MG TAB
500 mg | ORAL_TABLET | Freq: Every day | ORAL | 0 refills | Status: AC
Start: 2017-09-12 — End: ?

## 2017-09-12 NOTE — Telephone Encounter (Addendum)
Pt L/M on voice mail saying she moved to [place], [place] and hasn't been able to find a Dr there yet, she is wondering if Dr. Noe Gens would be able to refill all of her meds x 1 mo.

## 2017-09-12 NOTE — Telephone Encounter (Signed)
That's fine

## 2017-09-12 NOTE — Telephone Encounter (Signed)
Pt advised of below note and expressed understanding, she is requesting that the Rx's be sent to [facility] and said she will have them transferred to a [facility] near her.

## 2017-09-12 NOTE — Telephone Encounter (Addendum)
See note below.   Per last ofv on 02/28/17:      "Return in about 6 months (around 09/02/2017) for PHYSICAL, With PreVisit Labs".       Meds pended x 1 mo and forwarded to Provider for review.

## 2018-06-20 ENCOUNTER — Encounter (HOSPITAL_COMMUNITY): Payer: Self-pay | Admitting: Emergency Medicine

## 2018-06-20 ENCOUNTER — Ambulatory Visit (HOSPITAL_COMMUNITY)
Admission: EM | Admit: 2018-06-20 | Discharge: 2018-06-20 | Disposition: A | Discharge status: Home / Self Care | Payer: Self-pay | Attending: Family Medicine | Admitting: Family Medicine

## 2018-06-20 ENCOUNTER — Other Ambulatory Visit: Payer: Self-pay

## 2018-06-20 DIAGNOSIS — Z7984 Long term (current) use of oral hypoglycemic drugs: Secondary | ICD-10-CM | POA: Insufficient documentation

## 2018-06-20 DIAGNOSIS — N390 Urinary tract infection, site not specified: Secondary | ICD-10-CM | POA: Insufficient documentation

## 2018-06-20 DIAGNOSIS — Z882 Allergy status to sulfonamides status: Secondary | ICD-10-CM | POA: Insufficient documentation

## 2018-06-20 DIAGNOSIS — Z79899 Other long term (current) drug therapy: Secondary | ICD-10-CM | POA: Insufficient documentation

## 2018-06-20 DIAGNOSIS — I10 Essential (primary) hypertension: Secondary | ICD-10-CM | POA: Insufficient documentation

## 2018-06-20 DIAGNOSIS — E119 Type 2 diabetes mellitus without complications: Secondary | ICD-10-CM | POA: Insufficient documentation

## 2018-06-20 DIAGNOSIS — K219 Gastro-esophageal reflux disease without esophagitis: Secondary | ICD-10-CM | POA: Insufficient documentation

## 2018-06-20 HISTORY — DX: Essential (primary) hypertension: I10

## 2018-06-20 HISTORY — DX: Type 2 diabetes mellitus without complications: E11.9

## 2018-06-20 HISTORY — DX: Gastro-esophageal reflux disease without esophagitis: K21.9

## 2018-06-20 LAB — POCT URINALYSIS DIP (DEVICE)
Bilirubin Urine: NEGATIVE
Glucose, UA: NEGATIVE mg/dL
Ketones, ur: NEGATIVE mg/dL
Nitrite: POSITIVE — AB
PROTEIN: 100 mg/dL — AB
Specific Gravity, Urine: 1.025 (ref 1.005–1.030)
UROBILINOGEN UA: 0.2 mg/dL (ref 0.0–1.0)
pH: 5.5 (ref 5.0–8.0)

## 2018-06-20 MED ORDER — PHENAZOPYRIDINE HCL 200 MG PO TABS: 200.0000 mg | ORAL_TABLET | Freq: Three times a day (TID) | ORAL | 0 refills | Status: DC

## 2018-06-20 MED ORDER — NITROFURANTOIN MONOHYD MACRO 100 MG PO CAPS: 100.0000 mg | ORAL_CAPSULE | Freq: Two times a day (BID) | ORAL | 0 refills | Status: DC

## 2018-06-20 NOTE — Discharge Instructions (Signed)
Push fluids Take the antibiotic 2 x a day Take the pyridium for urinary pain We did lab testing during this visit. (culture).  If there are any abnormal findings that require change in medicine or indicate a positive result, you will be notified.  If all of your tests are normal, you will not be called.

## 2018-06-20 NOTE — ED Triage Notes (Signed)
[facility] a [facility] pain and dysuria/frequncy x 2 days.

## 2018-06-20 NOTE — ED Provider Notes (Signed)
[facility]    CSN: [id number] Arrival date & time: 06/20/18  1813     History   Chief Complaint Chief Complaint  Patient presents with  . Dysuria  . Abdominal Pain    HPI Sally Bender is a 47 y.o. female.   HPI  Patient states she started to have some suprapubic pressure and pain yesterday.  No fever or chills.  No nausea or vomiting.  Today when she got up she had pain with urination.  She tried some hydration with cranberry juice.  Is a day has gone on the dysuria has gotten worse.  No hematuria.  No vaginal discharge.  Patient states she is not sexually active.  She has had urinary tract infections in the past.  No kidney stones or kidney infections.  Past Medical History:  Diagnosis Date  . Acid reflux   . Diabetes mellitus without complication (HCC)   . Hypertension     There are no active problems to display for this patient.   Past Surgical History:  Procedure Laterality Date  . ANKLE SURGERY Left   . TUBAL LIGATION    . WRIST SURGERY Left     OB History   None      Home Medications    Prior to Admission medications   Medication Sig Start Date End Date Taking? Authorizing Provider  hydrochlorothiazide (HYDRODIURIL) 25 MG tablet Take 25 mg by mouth daily.   Yes [provider]  losartan (COZAAR) 50 MG tablet Take 50 mg by mouth daily.   Yes [provider]  omeprazole (PRILOSEC) 20 MG capsule Take 20 mg by mouth daily.   Yes [provider]  glipiZIDE (GLUCOTROL) 10 MG tablet Take 10 mg by mouth daily before breakfast.    [provider]  nitrofurantoin, macrocrystal-monohydrate, (MACROBID) 100 MG capsule Take 1 capsule (100 mg total) by mouth 2 (two) times daily. 06/20/18   Eustace MooreNelson, Yvonne Sue, MD  phenazopyridine (PYRIDIUM) 200 MG tablet Take 1 tablet (200 mg total) by mouth 3 (three) times daily. 06/20/18   Eustace MooreNelson, Yvonne Sue, MD    Family History History reviewed. No pertinent family history. Patient  denies family history of kidney disease, kidney stones Social History Social History   Tobacco Use  . Smoking status: Never Smoker  . Smokeless tobacco: Never Used  Substance Use Topics  . Alcohol use: Yes    Frequency: Never    Comment: Occ  . Drug use: Never     Allergies   Sulfa antibiotics   Review of Systems Review of Systems   Physical Exam Triage Vital Signs ED Triage Vitals [06/20/18 1824]  Enc Vitals Group     BP (!) 154/95     Pulse Rate (!) 102     Resp 18     Temp 98.8 F (37.1 C)     Temp Source Oral     SpO2 98 %     Weight      Height      Head Circumference      Peak Flow      Pain Score 8     Pain Loc      Pain Edu?      Excl. in GC?    No data found.  Updated Vital Signs BP (!) 154/95 (BP Location: Left Arm)   Pulse (!) 102   Temp 98.8 F (37.1 C) (Oral)   Resp 18   LMP 06/09/2018   SpO2 98%   Visual  Acuity Right Eye Distance:   Left Eye Distance:   Bilateral Distance:    Right Eye Near:   Left Eye Near:    Bilateral Near:     Physical Exam  Constitutional: She appears well-developed and well-nourished. No distress.  HENT:  Head: Normocephalic and atraumatic.  Mouth/Throat: Oropharynx is clear and moist.  Eyes: Pupils are equal, round, and reactive to light. Conjunctivae are normal.  Neck: Normal range of motion.  Cardiovascular: Normal rate, regular rhythm and normal heart sounds.  Pulmonary/Chest: Effort normal and breath sounds normal. No respiratory distress.  Abdominal: Soft. Bowel sounds are normal. She exhibits no distension. There is no tenderness. There is no CVA tenderness.  Musculoskeletal: Normal range of motion. She exhibits no edema.  Neurological: She is alert.  Skin: Skin is warm and dry.  Psychiatric: She has a normal mood and affect. Her behavior is normal.     UC Treatments / Results  Labs (all labs ordered are listed, but only abnormal results are displayed) Labs Reviewed  POCT URINALYSIS DIP  (DEVICE) - Abnormal; Notable for the following components:      Result Value   Hgb urine dipstick LARGE (*)    Protein, ur 100 (*)    Nitrite POSITIVE (*)    Leukocytes, UA SMALL (*)    All other components within normal limits  URINE CULTURE    EKG None  Radiology No results found.  Procedures Procedures (including critical care time)  Medications Ordered in UC Medications - No data to display  Initial Impression / Assessment and Plan / UC Course  I have reviewed the triage vital signs and the nursing notes.  Pertinent labs & imaging results that were available during my care of the patient were reviewed by me and considered in my medical decision making (see chart for details).     Discussed cystitis.  Fluids.  Antibiotics.  Pyridium for pain.  Discussed culture, and that we will call her only if her culture reveals bacteria that would be resistant to Macrodantin.  To ER for worsening pain, fever chills, vomiting Final Clinical Impressions(s) / UC Diagnoses   Final diagnoses:  Lower urinary tract infectious disease     Discharge Instructions     Push fluids Take the antibiotic 2 x a day Take the pyridium for urinary pain We did lab testing during this visit. (culture).  If there are any abnormal findings that require change in medicine or indicate a positive result, you will be notified.  If all of your tests are normal, you will not be called.      ED Prescriptions    Medication Sig Dispense Auth. Provider   nitrofurantoin, macrocrystal-monohydrate, (MACROBID) 100 MG capsule Take 1 capsule (100 mg total) by mouth 2 (two) times daily. 10 capsule Eustace Moore, MD   phenazopyridine (PYRIDIUM) 200 MG tablet Take 1 tablet (200 mg total) by mouth 3 (three) times daily. 6 tablet Eustace Moore, MD     Controlled Substance Prescriptions [place] Controlled Substance Registry consulted? Not Applicable   Eustace Moore, MD 06/20/18 2156

## 2018-06-23 ENCOUNTER — Telehealth (HOSPITAL_COMMUNITY): Payer: Self-pay

## 2018-06-23 LAB — URINE CULTURE

## 2018-06-23 NOTE — Telephone Encounter (Signed)
Urine culture positive for E.coli. This was treated with Macrobid at urgent care visit. Attempted to reach patient. No answer at this time. 

## 2018-06-27 ENCOUNTER — Encounter (HOSPITAL_COMMUNITY): Payer: Self-pay

## 2018-06-27 ENCOUNTER — Ambulatory Visit (HOSPITAL_COMMUNITY)
Admission: EM | Admit: 2018-06-27 | Discharge: 2018-06-27 | Disposition: A | Discharge status: Home / Self Care | Payer: Self-pay | Attending: Internal Medicine | Admitting: Internal Medicine

## 2018-06-27 DIAGNOSIS — J209 Acute bronchitis, unspecified: Secondary | ICD-10-CM

## 2018-06-27 MED ORDER — BENZONATATE 200 MG PO CAPS: 200.0000 mg | ORAL_CAPSULE | Freq: Three times a day (TID) | ORAL | 0 refills | Status: AC | PRN

## 2018-06-27 MED ORDER — ALBUTEROL SULFATE HFA 108 (90 BASE) MCG/ACT IN AERS: 1.0000 | INHALATION_SPRAY | Freq: Four times a day (QID) | RESPIRATORY_TRACT | 0 refills | Status: AC | PRN

## 2018-06-27 MED ORDER — AZITHROMYCIN 250 MG PO TABS: 250.0000 mg | ORAL_TABLET | Freq: Every day | ORAL | 0 refills | Status: DC

## 2018-06-27 MED ORDER — CETIRIZINE HCL 10 MG PO CAPS: 10.0000 mg | ORAL_CAPSULE | Freq: Every day | ORAL | 0 refills | Status: AC

## 2018-06-27 NOTE — ED Notes (Signed)
Bed: UC01 Expected date:  Expected time:  Means of arrival:  Comments: Appointments only please

## 2018-06-27 NOTE — ED Provider Notes (Signed)
[facility]    CSN: [id number] Arrival date & time: 06/27/18  1456     History   Chief Complaint Chief Complaint  Patient presents with  . Cough    coughing up mucous    HPI Sally Bender is a 47 y.o. female  history of hypertension, DM type II, acid reflux, anxiety and depression presenting today for evaluation of a cough.  Patient states that the past 10 days she has had a persistent cough, significant congestion and thick phlegm.  Also notes sore throat, but mainly in the morning, resolves during the daytime.  She states that 2 weeks ago she had similar symptoms that improved, but worsened again little over a week ago.  She feels sinus pressure as well as congestion in her chest.  She will frequently feel short of breath and says that she is suffocating, but denies wheezing.  She has previously been diagnosed with asthma, and has been using an inhaler previously prescribed to her.  She has also been using Mucinex.  She denies any fevers, nausea or vomiting.  Does endorse some abdominal pain, but relates this to muscle straining from coughing.  HPI  Past Medical History:  Diagnosis Date  . Acid reflux   . Diabetes mellitus without complication (HCC)   . Hypertension     There are no active problems to display for this patient.   Past Surgical History:  Procedure Laterality Date  . ANKLE SURGERY Left   . TUBAL LIGATION    . WRIST SURGERY Left     OB History   None      Home Medications    Prior to Admission medications   Medication Sig Start Date End Date Taking? Authorizing Provider  albuterol (PROVENTIL HFA;VENTOLIN HFA) 108 (90 Base) MCG/ACT inhaler Inhale 1-2 puffs into the lungs every 6 (six) hours as needed for wheezing or shortness of breath. 06/27/18   Wieters, Hallie C, PA-C  azithromycin (ZITHROMAX) 250 MG tablet Take 1 tablet (250 mg total) by mouth daily. Take first 2 tablets together, then 1 every day until finished. 06/27/18   Wieters, Hallie  C, PA-C  benzonatate (TESSALON) 200 MG capsule Take 1 capsule (200 mg total) by mouth 3 (three) times daily as needed for up to 7 days for cough. 06/27/18 07/04/18  Wieters, Hallie C, PA-C  Cetirizine HCl 10 MG CAPS Take 1 capsule (10 mg total) by mouth daily for 10 days. 06/27/18 07/07/18  Wieters, Hallie C, PA-C  glipiZIDE (GLUCOTROL) 10 MG tablet Take 10 mg by mouth daily before breakfast.    [provider]  hydrochlorothiazide (HYDRODIURIL) 25 MG tablet Take 25 mg by mouth daily.    [provider]  losartan (COZAAR) 50 MG tablet Take 50 mg by mouth daily.    [provider]  nitrofurantoin, macrocrystal-monohydrate, (MACROBID) 100 MG capsule Take 1 capsule (100 mg total) by mouth 2 (two) times daily. 06/20/18   Eustace Moore, MD  omeprazole (PRILOSEC) 20 MG capsule Take 20 mg by mouth daily.    [provider]  phenazopyridine (PYRIDIUM) 200 MG tablet Take 1 tablet (200 mg total) by mouth 3 (three) times daily. 06/20/18   Eustace Moore, MD    Family History History reviewed. No pertinent family history.  Social History Social History   Tobacco Use  . Smoking status: Never Smoker  . Smokeless tobacco: Never Used  Substance Use Topics  . Alcohol use: Yes    Frequency: Never  Comment: Occ  . Drug use: Never     Allergies   Sulfa antibiotics   Review of Systems Review of Systems  Constitutional: Negative for activity change, appetite change, chills, fatigue and fever.  HENT: Positive for congestion, rhinorrhea, sinus pressure and sore throat. Negative for ear pain and trouble swallowing.   Eyes: Negative for discharge and redness.  Respiratory: Positive for cough and shortness of breath. Negative for chest tightness and wheezing.   Cardiovascular: Negative for chest pain.  Gastrointestinal: Negative for abdominal pain, diarrhea, nausea and vomiting.  Musculoskeletal: Negative for myalgias.  Skin: Negative for rash.  Neurological:  Positive for light-headedness. Negative for dizziness and headaches.     Physical Exam Triage Vital Signs ED Triage Vitals [06/27/18 1507]  Enc Vitals Group     BP 113/64     Pulse Rate 91     Resp 18     Temp 98.3 F (36.8 C)     Temp Source Oral     SpO2 97 %     Weight      Height      Head Circumference      Peak Flow      Pain Score      Pain Loc      Pain Edu?      Excl. in GC?    No data found.  Updated Vital Signs BP 113/64 (BP Location: Right Arm)   Pulse 91   Temp 98.3 F (36.8 C) (Oral)   Resp 18   LMP 06/09/2018   SpO2 97%   Visual Acuity Right Eye Distance:   Left Eye Distance:   Bilateral Distance:    Right Eye Near:   Left Eye Near:    Bilateral Near:     Physical Exam  Constitutional: She appears well-developed and well-nourished. No distress.  HENT:  Head: Normocephalic and atraumatic.  Bilateral ears without tenderness to palpation of external auricle, tragus and mastoid, EAC's without erythema or swelling, TM's with good bony landmarks and cone of light. Non erythematous.  Oral mucosa pink and moist, no tonsillar enlargement or exudate. Posterior pharynx patent and erythematous, no uvula deviation or swelling. Normal phonation.  Eyes: Conjunctivae are normal.  Neck: Neck supple.  Cardiovascular: Normal rate and regular rhythm.  No murmur heard. Pulmonary/Chest: Effort normal and breath sounds normal. No respiratory distress.  Breathing comfortably at rest, CTABL, no wheezing, rales or other adventitious sounds auscultated; occasional coughing episodes in room-sound dry  Abdominal: Soft. There is no tenderness.  Musculoskeletal: She exhibits no edema.  Neurological: She is alert.  Skin: Skin is warm and dry.  Psychiatric: She has a normal mood and affect.  Nursing note and vitals reviewed.    UC Treatments / Results  Labs (all labs ordered are listed, but only abnormal results are displayed) Labs Reviewed - No data to  display  EKG None  Radiology No results found.  Procedures Procedures (including critical care time)  Medications Ordered in UC Medications - No data to display  Initial Impression / Assessment and Plan / UC Course  I have reviewed the triage vital signs and the nursing notes.  Pertinent labs & imaging results that were available during my care of the patient were reviewed by me and considered in my medical decision making (see chart for details).     Patient with persistent cough, recurrent sinus symptoms, symptoms greater than 10 days we will treat with azithromycin to cover atypical lower respiratory infection as well  as sinusitis.  Discussed further symptomatic management, recommendations below.  Patient concerned about cost discussed over the counter medicines that she may try.Discussed strict return precautions. Patient verbalized understanding and is agreeable with plan.  Final Clinical Impressions(s) / UC Diagnoses   Final diagnoses:  Acute bronchitis, unspecified organism     Discharge Instructions     Begin azithromycin- 2 tablets today, 1 tablet for the following 4 days- this will treat sinus infection/atypical lung infection  For congestion please continue to take Mucinex, may.  Daily allergy pill like Zyrtec, Claritin or Allegra, you may get this over-the-counter store brand  For cough I sent in Tessalon, you may use this as needed, you may also use over-the-counter Delsym, Robitussin or Robitussin-DM if this is cheaper for you  Albuterol inhaler as needed for shortness of breath, chest tightness  Tylenol and ibuprofen for any muscular pains related to coughing  Please follow-up if symptoms not improving, symptoms worsening or not getting better in the next 1 to 2 weeks.  Please return sooner if worsening, developing difficulty breathing, shortness of breath, fever   ED Prescriptions    Medication Sig Dispense Auth. Provider   azithromycin (ZITHROMAX) 250  MG tablet Take 1 tablet (250 mg total) by mouth daily. Take first 2 tablets together, then 1 every day until finished. 6 tablet Wieters, Hallie C, PA-C   albuterol (PROVENTIL HFA;VENTOLIN HFA) 108 (90 Base) MCG/ACT inhaler Inhale 1-2 puffs into the lungs every 6 (six) hours as needed for wheezing or shortness of breath. 1 Inhaler Wieters, Hallie C, PA-C   benzonatate (TESSALON) 200 MG capsule Take 1 capsule (200 mg total) by mouth 3 (three) times daily as needed for up to 7 days for cough. 28 capsule Wieters, Hallie C, PA-C   Cetirizine HCl 10 MG CAPS Take 1 capsule (10 mg total) by mouth daily for 10 days. 10 capsule Wieters, Hallie C, PA-C     Controlled Substance Prescriptions [place] Controlled Substance Registry consulted? Not Applicable   Lew DawesWieters, Hallie C, [place]PA-C 06/27/18 1552

## 2018-06-27 NOTE — Discharge Instructions (Addendum)
Begin azithromycin- 2 tablets today, 1 tablet for the following 4 days- this will treat sinus infection/atypical lung infection  For congestion please continue to take Mucinex, may.  Daily allergy pill like Zyrtec, Claritin or Allegra, you may get this over-the-counter store brand  For cough I sent in Tessalon, you may use this as needed, you may also use over-the-counter Delsym, Robitussin or Robitussin-DM if this is cheaper for you  Albuterol inhaler as needed for shortness of breath, chest tightness  Tylenol and ibuprofen for any muscular pains related to coughing  Please follow-up if symptoms not improving, symptoms worsening or not getting better in the next 1 to 2 weeks.  Please return sooner if worsening, developing difficulty breathing, shortness of breath, fever

## 2018-06-27 NOTE — ED Triage Notes (Signed)
Pt presents with an ongoing cough; coughing up mucous

## 2018-07-05 ENCOUNTER — Other Ambulatory Visit: Payer: Self-pay

## 2018-07-05 ENCOUNTER — Encounter (HOSPITAL_COMMUNITY): Payer: Self-pay

## 2018-07-05 ENCOUNTER — Ambulatory Visit (HOSPITAL_COMMUNITY)
Admission: EM | Admit: 2018-07-05 | Discharge: 2018-07-05 | Disposition: A | Discharge status: Home / Self Care | Payer: Self-pay | Attending: Family Medicine | Admitting: Family Medicine

## 2018-07-05 ENCOUNTER — Telehealth (HOSPITAL_COMMUNITY): Payer: Self-pay | Admitting: *Deleted

## 2018-07-05 DIAGNOSIS — I10 Essential (primary) hypertension: Secondary | ICD-10-CM | POA: Insufficient documentation

## 2018-07-05 DIAGNOSIS — Z882 Allergy status to sulfonamides status: Secondary | ICD-10-CM | POA: Insufficient documentation

## 2018-07-05 DIAGNOSIS — E119 Type 2 diabetes mellitus without complications: Secondary | ICD-10-CM | POA: Insufficient documentation

## 2018-07-05 DIAGNOSIS — Z79899 Other long term (current) drug therapy: Secondary | ICD-10-CM | POA: Insufficient documentation

## 2018-07-05 DIAGNOSIS — R300 Dysuria: Secondary | ICD-10-CM | POA: Insufficient documentation

## 2018-07-05 DIAGNOSIS — N3090 Cystitis, unspecified without hematuria: Secondary | ICD-10-CM | POA: Insufficient documentation

## 2018-07-05 DIAGNOSIS — K219 Gastro-esophageal reflux disease without esophagitis: Secondary | ICD-10-CM | POA: Insufficient documentation

## 2018-07-05 DIAGNOSIS — Z7984 Long term (current) use of oral hypoglycemic drugs: Secondary | ICD-10-CM | POA: Insufficient documentation

## 2018-07-05 LAB — POCT URINALYSIS DIP (DEVICE)
Bilirubin Urine: NEGATIVE
GLUCOSE, UA: 500 mg/dL — AB
Ketones, ur: NEGATIVE mg/dL
NITRITE: POSITIVE — AB
Protein, ur: NEGATIVE mg/dL
Specific Gravity, Urine: 1.02 (ref 1.005–1.030)
UROBILINOGEN UA: 1 mg/dL (ref 0.0–1.0)
pH: 5.5 (ref 5.0–8.0)

## 2018-07-05 MED ORDER — CEPHALEXIN 500 MG PO CAPS: 500.0000 mg | ORAL_CAPSULE | Freq: Two times a day (BID) | ORAL | 0 refills | Status: AC

## 2018-07-05 MED ORDER — CEPHALEXIN 500 MG PO CAPS: 500.0000 mg | ORAL_CAPSULE | Freq: Two times a day (BID) | ORAL | 0 refills | Status: DC

## 2018-07-05 NOTE — ED Provider Notes (Signed)
[facility]    CSN: [id number] Arrival date & time: 07/05/18  1021     History   Chief Complaint Chief Complaint  Patient presents with  . Urinary Tract Infection    HPI Luberta MutterSheila Whisenant is a 47 y.o. female.   47 year old female comes in for 1 week history of UTI symptoms.  She has had urinary frequency, dysuria without obvious hematuria.  She has some cramping sensation around the low abdomen that is intermittent.  Denies nausea, vomiting.  Denies fever, chills, night sweats.  Denies vaginal discharge, vaginal irritation, vaginal bleeding.  She is sexually active with one female partner, no condom use.  LMP 06/09/2018.  History of tubal ligation. Has been using AZO with some relief.      Past Medical History:  Diagnosis Date  . Acid reflux   . Diabetes mellitus without complication (HCC)   . Hypertension     There are no active problems to display for this patient.   Past Surgical History:  Procedure Laterality Date  . ANKLE SURGERY Left   . TUBAL LIGATION    . WRIST SURGERY Left     OB History   None      Home Medications    Prior to Admission medications   Medication Sig Start Date End Date Taking? Authorizing Provider  albuterol (PROVENTIL HFA;VENTOLIN HFA) 108 (90 Base) MCG/ACT inhaler Inhale 1-2 puffs into the lungs every 6 (six) hours as needed for wheezing or shortness of breath. 06/27/18  Yes Wieters, Hallie C, PA-C  Cetirizine HCl 10 MG CAPS Take 1 capsule (10 mg total) by mouth daily for 10 days. 06/27/18 07/07/18 Yes Wieters, Hallie C, PA-C  glipiZIDE (GLUCOTROL) 10 MG tablet Take 10 mg by mouth daily before breakfast.   Yes [provider]  hydrochlorothiazide (HYDRODIURIL) 25 MG tablet Take 25 mg by mouth daily.   Yes [provider]  losartan (COZAAR) 50 MG tablet Take 50 mg by mouth daily.   Yes [provider]  omeprazole (PRILOSEC) 20 MG capsule Take 20 mg by mouth daily.   Yes [provider]    azithromycin (ZITHROMAX) 250 MG tablet Take 1 tablet (250 mg total) by mouth daily. Take first 2 tablets together, then 1 every day until finished. 06/27/18   Wieters, Hallie C, PA-C  cephALEXin (KEFLEX) 500 MG capsule Take 1 capsule (500 mg total) by mouth 2 (two) times daily for 7 days. 07/05/18 07/12/18  Belinda FisherYu, Dare Sanger V, PA-C    Family History History reviewed. No pertinent family history.  Social History Social History   Tobacco Use  . Smoking status: Never Smoker  . Smokeless tobacco: Never Used  Substance Use Topics  . Alcohol use: Yes    Frequency: Never    Comment: Occ  . Drug use: Never     Allergies   Sulfa antibiotics   Review of Systems Review of Systems  Reason unable to perform ROS: See HPI as above.     Physical Exam Triage Vital Signs ED Triage Vitals [07/05/18 1050]  Enc Vitals Group     BP 126/81     Pulse Rate 67     Resp 16     Temp 98.5 F (36.9 C)     Temp Source Oral     SpO2 98 %     Weight      Height      Head Circumference      Peak Flow      Pain Score  8     Pain Loc      Pain Edu?      Excl. in GC?    No data found.  Updated Vital Signs BP 126/81 (BP Location: Left Arm)   Pulse 67   Temp 98.5 F (36.9 C) (Oral)   Resp 16   LMP 06/09/2018 (Exact Date)   SpO2 98%   Physical Exam  Constitutional: She is oriented to person, place, and time. She appears well-developed and well-nourished. No distress.  HENT:  Head: Normocephalic and atraumatic.  Eyes: Pupils are equal, round, and reactive to light. Conjunctivae are normal.  Cardiovascular: Normal rate, regular rhythm and normal heart sounds. Exam reveals no gallop and no friction rub.  No murmur heard. Pulmonary/Chest: Effort normal and breath sounds normal. No stridor. No respiratory distress. She has no wheezes. She has no rales.  Abdominal: Soft. Bowel sounds are normal. She exhibits no mass. There is no tenderness. There is no rebound, no guarding and no CVA tenderness.   Neurological: She is alert and oriented to person, place, and time.  Skin: Skin is warm and dry.  Psychiatric: She has a normal mood and affect. Her behavior is normal. Judgment normal.   UC Treatments / Results  Labs (all labs ordered are listed, but only abnormal results are displayed) Labs Reviewed  POCT URINALYSIS DIP (DEVICE) - Abnormal; Notable for the following components:      Result Value   Glucose, UA 500 (*)    Hgb urine dipstick TRACE (*)    Nitrite POSITIVE (*)    Leukocytes, UA SMALL (*)    All other components within normal limits  URINE CULTURE    EKG None  Radiology No results found.  Procedures Procedures (including critical care time)  Medications Ordered in UC Medications - No data to display  Initial Impression / Assessment and Plan / UC Course  I have reviewed the triage vital signs and the nursing notes.  Pertinent labs & imaging results that were available during my care of the patient were reviewed by me and considered in my medical decision making (see chart for details).    Patient with positive nitrite, small leuks, trace blood in urine.  Although this could be false positive from Pyridium use, patient's symptoms consistent with UTI as well.  Will treat with Keflex, and send for urine culture.  Push fluids.  Return precautions given.  Patient expresses understanding and agrees to plan.  Final Clinical Impressions(s) / UC Diagnoses   Final diagnoses:  Cystitis    ED Prescriptions    Medication Sig Dispense Auth. Provider   cephALEXin (KEFLEX) 500 MG capsule Take 1 capsule (500 mg total) by mouth 2 (two) times daily for 7 days. 14 capsule Threasa Alpha, [place] 07/05/18 1132

## 2018-07-05 NOTE — ED Triage Notes (Signed)
[facility], pt complains of burning and frequent urination pt explains pain as feeling like razor blades cutting during urination.

## 2018-07-05 NOTE — Discharge Instructions (Signed)
Your urine was positive for an urinary tract infection. Start keflex as directed. Keep hydrated, your urine should be clear to pale yellow in color. As discussed, abdominal discomfort could also be from constipation, please get miralax or colace over the counter to help move bowels. Monitor for any worsening of symptoms, fever, worsening abdominal pain, nausea/vomiting, flank pain, follow up for reevaluation.

## 2018-07-07 LAB — URINE CULTURE

## 2018-11-23 ENCOUNTER — Encounter (HOSPITAL_COMMUNITY): Payer: Self-pay

## 2018-11-23 ENCOUNTER — Emergency Department (HOSPITAL_COMMUNITY)
Admission: EM | Admit: 2018-11-23 | Discharge: 2018-11-23 | Disposition: A | Discharge status: Home / Self Care | Payer: Worker's Compensation | Attending: Emergency Medicine | Admitting: Emergency Medicine

## 2018-11-23 ENCOUNTER — Emergency Department (HOSPITAL_COMMUNITY): Payer: Worker's Compensation

## 2018-11-23 DIAGNOSIS — I10 Essential (primary) hypertension: Secondary | ICD-10-CM | POA: Diagnosis not present

## 2018-11-23 DIAGNOSIS — Y990 Civilian activity done for income or pay: Secondary | ICD-10-CM | POA: Diagnosis not present

## 2018-11-23 DIAGNOSIS — M25562 Pain in left knee: Secondary | ICD-10-CM | POA: Insufficient documentation

## 2018-11-23 DIAGNOSIS — Z7984 Long term (current) use of oral hypoglycemic drugs: Secondary | ICD-10-CM | POA: Diagnosis not present

## 2018-11-23 DIAGNOSIS — Y93F9 Activity, other caregiving: Secondary | ICD-10-CM | POA: Insufficient documentation

## 2018-11-23 DIAGNOSIS — X500XXA Overexertion from strenuous movement or load, initial encounter: Secondary | ICD-10-CM | POA: Insufficient documentation

## 2018-11-23 DIAGNOSIS — E119 Type 2 diabetes mellitus without complications: Secondary | ICD-10-CM | POA: Insufficient documentation

## 2018-11-23 DIAGNOSIS — Y92129 Unspecified place in nursing home as the place of occurrence of the external cause: Secondary | ICD-10-CM | POA: Insufficient documentation

## 2018-11-23 DIAGNOSIS — S8992XA Unspecified injury of left lower leg, initial encounter: Secondary | ICD-10-CM | POA: Diagnosis present

## 2018-11-23 MED ORDER — ACETAMINOPHEN 500 MG PO TABS: 500.0000 mg | ORAL_TABLET | Freq: Four times a day (QID) | ORAL | 0 refills | Status: AC | PRN

## 2018-11-23 MED ORDER — IBUPROFEN 600 MG PO TABS: 600.0000 mg | ORAL_TABLET | Freq: Four times a day (QID) | ORAL | 0 refills | Status: AC | PRN

## 2018-11-23 NOTE — Discharge Instructions (Addendum)
Your xray showed   1. Medications: Alternate 600 mg of ibuprofen and [phone] mg of Tylenol every 3 hours as needed for pain. Do not exceed 4000 mg of Tylenol daily.  Take ibuprofen with food to avoid upset stomach issues.  You can also take it with an acid reflux medication such as Tums or Pepcid to avoid upset stomach issues. 2. Treatment: rest, ice, elevate and use brace/crutches, drink plenty of fluids, gentle stretching 3. Follow Up: Please followup with orthopedics as directed or your PCP in 1 week if no improvement for discussion of your diagnoses and further evaluation after today's visit; if you do not have a primary care doctor use the resource guide provided to find one; Please return to the ER for worsening symptoms or other concerns such as worsening swelling, redness of the skin, fevers, loss of pulses, or loss of feeling

## 2018-11-23 NOTE — ED Triage Notes (Signed)
Pt presents to ED for evaluation for left knee pain that started while lifting a pt at work. 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 ROM limited in left leg, pt able to bear weight.

## 2018-11-23 NOTE — ED Provider Notes (Signed)
[facility] EMERGENCY DEPARTMENT Provider Note   CSN: [id number] Arrival date & time: 11/23/18  1351     History   Chief Complaint Chief Complaint  Patient presents with  . Knee Pain    HPI Sally Bender is a 47 y.o. female with history of acid reflux, diabetes mellitus, hypertension presents for evaluation of acute onset, intermittent left knee pain secondary to injury earlier today.  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  She reports she did not have any difficulty at the time or when helping the resident get dressed but as she turned and placed weight on her left foot she felt something "pull" to the posterior aspect of the left knee.  She endorses sharp pain that occurs with attempts to weight-bear, primarily localized to the posterior lateral aspect of the left knee but radiates down into the toes.  She notes that she has a history of peripheral neuropathy and has numbness and tingling to the feet bilaterally at baseline which is chronic and unchanged.  She denies swelling, trauma, weakness, or fevers.  She has not tried anything for her symptoms.  She was sent by her employer to the ED for further evaluation.  The history is provided by the patient.    Past Medical History:  Diagnosis Date  . Acid reflux   . Diabetes mellitus without complication (HCC)   . Hypertension     There are no active problems to display for this patient.   Past Surgical History:  Procedure Laterality Date  . ANKLE SURGERY Left   . TUBAL LIGATION    . WRIST SURGERY Left      OB History   None      Home Medications    Prior to Admission medications   Medication Sig Start Date End Date Taking? Authorizing Provider  albuterol (PROVENTIL HFA;VENTOLIN HFA) 108 (90 Base) MCG/ACT inhaler Inhale 1-2 puffs into the lungs every 6 (six) hours as needed for wheezing or shortness of breath. 06/27/18   Wieters,  Hallie C, PA-C  azithromycin (ZITHROMAX) 250 MG tablet Take 1 tablet (250 mg total) by mouth daily. Take first 2 tablets together, then 1 every day until finished. 06/27/18   Wieters, Hallie C, PA-C  Cetirizine HCl 10 MG CAPS Take 1 capsule (10 mg total) by mouth daily for 10 days. 06/27/18 07/07/18  Wieters, Hallie C, PA-C  glipiZIDE (GLUCOTROL) 10 MG tablet Take 10 mg by mouth daily before breakfast.    [provider]  hydrochlorothiazide (HYDRODIURIL) 25 MG tablet Take 25 mg by mouth daily.    [provider]  losartan (COZAAR) 50 MG tablet Take 50 mg by mouth daily.    [provider]  omeprazole (PRILOSEC) 20 MG capsule Take 20 mg by mouth daily.    [provider]    Family History No family history on file.  Social History Social History   Tobacco Use  . Smoking status: Never Smoker  . Smokeless tobacco: Never Used  Substance Use Topics  . Alcohol use: Yes    Frequency: Never    Comment: Occ  . Drug use: Never     Allergies   Sulfa antibiotics   Review of Systems Review of Systems  Constitutional: Negative for fever.  Musculoskeletal: Positive for arthralgias.  Neurological: Positive for numbness (chronic, unchanged).     Physical Exam Updated Vital Signs BP (!) 143/86   Pulse 79  Temp 98.5 F (36.9 C) (Oral)   Resp 14   SpO2 100%   Physical Exam  Constitutional: She appears well-developed and well-nourished. No distress.  HENT:  Head: Normocephalic and atraumatic.  Eyes: Conjunctivae are normal. Right eye exhibits no discharge. Left eye exhibits no discharge.  Neck: No JVD present. No tracheal deviation present.  Cardiovascular: Normal rate and intact distal pulses.  2+ DP/PT pulses bilaterally, there is some swelling to the posterior aspect of the left knee.  Compartments are soft, no palpable cords, Homans sign absent bilaterally  Pulmonary/Chest: Effort normal.  Abdominal: She exhibits no distension.    Musculoskeletal: She exhibits tenderness. She exhibits no edema.       Left knee: She exhibits decreased range of motion. She exhibits no swelling, no effusion, no ecchymosis, no deformity, no laceration, no erythema, normal alignment, no LCL laxity, normal patellar mobility, no bony tenderness, normal meniscus and no MCL laxity. Tenderness found. LCL tenderness noted. No medial joint line, no lateral joint line, no MCL and no patellar tendon tenderness noted.  Mildly decreased range of motion of the left knee with flexion, no ligamentous laxity or varus or valgus instability.  Able to extend the left knee against gravity with no quadriceps tendon deformity.  5/5 strength of BLE major muscle groups.  Some swelling posteriorly to suggest ruptured Baker's cyst.  She does have tenderness overlying the LCL of the left knee.  No erythema, warmth, crepitus, or deformity.  Neurological: She is alert.  Fluent speech with no evidence of dysarthria, no facial droop, sensation intact to soft touch of bilateral lower extremities.  Patient ambulates with an antalgic gait and has some difficulty with Heel Walk and Toe Walk due to pain but exhibits good balance overall  Skin: Skin is warm and dry. Capillary refill takes less than 2 seconds. No erythema.  Psychiatric: She has a normal mood and affect. Her behavior is normal.  Nursing note and vitals reviewed.    ED Treatments / Results  Labs (all labs ordered are listed, but only abnormal results are displayed) Labs Reviewed - No data to display  EKG None  Radiology No results found.  Procedures Procedures (including critical care time)  Medications Ordered in ED Medications - No data to display   Initial Impression / Assessment and Plan / ED Course  I have reviewed the triage vital signs and the nursing notes.  Pertinent labs & imaging results that were available during my care of the patient were reviewed by me and considered in my medical  decision making (see chart for details).     Patient presents for evaluation of acute onset of left knee pain while at work today.  Patient is afebrile, vital signs are stable.  She is nontoxic in appearance.  She is neurovascularly intact.  She is ambulating and weightbearing with some difficulty but exhibits good balance.  Mechanism suggestive of possible LCL injury versus Baker's cyst rupture.  No evidence of DVT, quadriceps tendon rupture, or secondary skin infection.  Will obtain radiographs for further evaluation.  3:11 PM Signed out to oncoming provider PA Eulah PontMurphy. Pending imaging. If unremarkable, stable for discharge home with follow up with ortho on an outpatient basis.  I have discussed conservative therapy and management with the patient as well as strict indications for return to the ED. She has verbalized understanding of and agreement with this plan thus far.  If any significant abnormalities are noted, may require consultation to orthopedics for further recommendations.  Final Clinical Impressions(s) / ED Diagnoses   Final diagnoses:  Acute pain of left knee    ED Discharge Orders    None       Bennye Alm 11/23/18 1511    Linwood Dibbles, MD 11/23/18 [phone]

## 2019-02-08 ENCOUNTER — Encounter (HOSPITAL_COMMUNITY): Payer: Self-pay | Admitting: Emergency Medicine

## 2019-02-08 ENCOUNTER — Ambulatory Visit (HOSPITAL_COMMUNITY)
Admission: EM | Admit: 2019-02-08 | Discharge: 2019-02-08 | Disposition: A | Discharge status: Home / Self Care | Payer: PRIVATE HEALTH INSURANCE | Attending: Urgent Care | Admitting: Urgent Care

## 2019-02-08 DIAGNOSIS — I10 Essential (primary) hypertension: Secondary | ICD-10-CM

## 2019-02-08 DIAGNOSIS — R6889 Other general symptoms and signs: Secondary | ICD-10-CM

## 2019-02-08 DIAGNOSIS — R52 Pain, unspecified: Secondary | ICD-10-CM

## 2019-02-08 DIAGNOSIS — E119 Type 2 diabetes mellitus without complications: Secondary | ICD-10-CM

## 2019-02-08 MED ORDER — OSELTAMIVIR PHOSPHATE 75 MG PO CAPS: 75.0000 mg | ORAL_CAPSULE | Freq: Two times a day (BID) | ORAL | 0 refills | Status: DC

## 2019-02-08 MED ORDER — BENZONATATE 100 MG PO CAPS: 100.0000 mg | ORAL_CAPSULE | Freq: Three times a day (TID) | ORAL | 0 refills | Status: DC | PRN

## 2019-02-08 NOTE — ED Provider Notes (Signed)
MRN: [id number] DOB: 06/08/1971  Subjective:   Sally Bender is a 48 y.o. female presenting for 1 day history of acute onset moderate to severe worsening body aches, malaise. Has tried ibuprofen with minimal relief. Blood sugar ranges from 70s-180s.  She is very compliant with her diabetes, high blood pressure medications. Denies smoking cigarettes or drinking alcohol.  No current facility-administered medications for this encounter.   Current Outpatient Medications:  .  acetaminophen (TYLENOL) 500 MG tablet, Take 1 tablet (500 mg total) by mouth every 6 (six) hours as needed., Disp: 30 tablet, Rfl: 0 .  albuterol (PROVENTIL HFA;VENTOLIN HFA) 108 (90 Base) MCG/ACT inhaler, Inhale 1-2 puffs into the lungs every 6 (six) hours as needed for wheezing or shortness of breath., Disp: 1 Inhaler, Rfl: 0 .  Cetirizine HCl 10 MG CAPS, Take 1 capsule (10 mg total) by mouth daily for 10 days., Disp: 10 capsule, Rfl: 0 .  glipiZIDE (GLUCOTROL) 10 MG tablet, Take 10 mg by mouth daily before breakfast., Disp: , Rfl:  .  hydrochlorothiazide (HYDRODIURIL) 25 MG tablet, Take 25 mg by mouth daily., Disp: , Rfl:  .  ibuprofen (ADVIL,MOTRIN) 600 MG tablet, Take 1 tablet (600 mg total) by mouth every 6 (six) hours as needed., Disp: 30 tablet, Rfl: 0 .  losartan (COZAAR) 50 MG tablet, Take 50 mg by mouth daily., Disp: , Rfl:  .  omeprazole (PRILOSEC) 20 MG capsule, Take 20 mg by mouth daily., Disp: , Rfl:     Allergies  Allergen Reactions  . Sulfa Antibiotics Hives    Past Medical History:  Diagnosis Date  . Acid reflux   . Diabetes mellitus without complication (HCC)   . Hypertension      Past Surgical History:  Procedure Laterality Date  . ANKLE SURGERY Left   . TUBAL LIGATION    . WRIST SURGERY Left     Review of Systems  Constitutional: Positive for fever and malaise/fatigue.  HENT: Positive for congestion, ear pain (right sided) and sore throat. Negative for ear discharge and sinus pain.    Eyes: Negative for blurred vision, double vision, discharge and redness.  Respiratory: Positive for cough. Negative for hemoptysis, shortness of breath and wheezing.   Cardiovascular: Negative for chest pain.  Gastrointestinal: Negative for abdominal pain, diarrhea, nausea and vomiting.  Genitourinary: Negative for dysuria, flank pain and hematuria.  Musculoskeletal: Positive for myalgias.  Skin: Negative for rash.  Neurological: Positive for headaches. Negative for dizziness and weakness.  Psychiatric/Behavioral: Negative for depression and substance abuse.    Objective:   Vitals: BP 134/86 (BP Location: Right Arm)   Pulse (!) 110   Temp 99.7 F (37.6 C) (Temporal)   Resp 20   SpO2 100%   Physical Exam Constitutional:      General: She is not in acute distress.    Appearance: Normal appearance. She is well-developed. She is not ill-appearing, toxic-appearing or diaphoretic.  HENT:     Head: Normocephalic and atraumatic.     Right Ear: Tympanic membrane and ear canal normal. No drainage or tenderness. No middle ear effusion. Tympanic membrane is not erythematous.     Left Ear: Tympanic membrane and ear canal normal. No drainage or tenderness.  No middle ear effusion. Tympanic membrane is not erythematous.     Nose: Nose normal. No congestion or rhinorrhea.     Mouth/Throat:     Mouth: Mucous membranes are moist. No oral lesions.     Pharynx: Oropharynx is clear. No pharyngeal swelling, oropharyngeal  exudate, posterior oropharyngeal erythema or uvula swelling.     Tonsils: No tonsillar exudate or tonsillar abscesses.  Eyes:     Extraocular Movements: Extraocular movements intact.     Right eye: Normal extraocular motion.     Left eye: Normal extraocular motion.     Conjunctiva/sclera: Conjunctivae normal.     Pupils: Pupils are equal, round, and reactive to light.  Neck:     Musculoskeletal: Normal range of motion and neck supple.  Cardiovascular:     Rate and Rhythm:  Normal rate and regular rhythm.     Pulses: Normal pulses.     Heart sounds: Normal heart sounds. No murmur. No friction rub. No gallop.   Pulmonary:     Effort: Pulmonary effort is normal. No respiratory distress.     Breath sounds: Normal breath sounds. No stridor. No wheezing, rhonchi or rales.  Lymphadenopathy:     Cervical: No cervical adenopathy.  Skin:    General: Skin is warm and dry.     Findings: No rash.  Neurological:     General: No focal deficit present.     Mental Status: She is alert and oriented to person, place, and time.  Psychiatric:        Mood and Affect: Mood normal.        Behavior: Behavior normal.        Thought Content: Thought content normal.    Assessment and Plan :   Flu-like symptoms  Body aches  Type 2 diabetes mellitus without complication, without long-term current use of insulin (HCC)  Essential hypertension  Will cover for influenza with Tamiflu given symptom set, acute onset, physical exam findings.  Use supportive care, rest, fluids, hydration, light meals, schedule Tylenol and ibuprofen. Counseled patient on potential for adverse effects with medications prescribed today, patient verbalized understanding. ER and return-to-clinic precautions discussed, patient verbalized understanding.   Wallis Bamberg, PA-C 02/08/19 1125

## 2019-02-08 NOTE — ED Triage Notes (Signed)
Pt here for flu sx and body aches

## 2019-02-08 NOTE — Discharge Instructions (Addendum)
We will manage this as a viral syndrome. For sore throat or cough try using a honey-based tea. Use 3 teaspoons of honey with juice squeezed from half lemon. Place shaved pieces of ginger into 1/2-1 cup of water and warm over stove top. Then mix the ingredients and repeat every 4 hours as needed. Please take ibuprofen 400mg  every 6 hours alternating with OR taken together with Tylenol 500mg  every 6 hours. Hydrate very well with at least 2 liters of water. Eat light meals such as soups to replenish electrolytes and soft fruits, veggies. Start an antihistamine like Zyrtec, Allegra or Claritin for post-nasal drainage.

## 2019-03-26 ENCOUNTER — Encounter (HOSPITAL_COMMUNITY): Payer: Self-pay

## 2019-03-26 ENCOUNTER — Ambulatory Visit (HOSPITAL_COMMUNITY)
Admission: EM | Admit: 2019-03-26 | Discharge: 2019-03-26 | Disposition: A | Discharge status: Home / Self Care | Payer: PRIVATE HEALTH INSURANCE | Attending: Family Medicine | Admitting: Family Medicine

## 2019-03-26 DIAGNOSIS — N390 Urinary tract infection, site not specified: Secondary | ICD-10-CM

## 2019-03-26 LAB — POCT URINALYSIS DIP (DEVICE)
Bilirubin Urine: NEGATIVE
Glucose, UA: NEGATIVE mg/dL
Hgb urine dipstick: NEGATIVE
Ketones, ur: NEGATIVE mg/dL
Nitrite: NEGATIVE
Protein, ur: NEGATIVE mg/dL
Specific Gravity, Urine: >=1.03 (ref 1.005–1.030)
Urobilinogen, UA: 0.2 mg/dL (ref 0.0–1.0)
pH: 5.5 (ref 5.0–8.0)

## 2019-03-26 MED ORDER — NITROFURANTOIN MONOHYD MACRO 100 MG PO CAPS: 100.0000 mg | ORAL_CAPSULE | Freq: Two times a day (BID) | ORAL | 0 refills | Status: AC

## 2019-03-26 NOTE — ED Provider Notes (Signed)
[facility]    CSN: [id number] Arrival date & time: 03/26/19  1358     History   Chief Complaint Chief Complaint  Patient presents with  . Urinary Frequency    HPI Sally Bender is a 48 y.o. female.   HPI  Patient states that she gets urinary tract infections about twice a year.  She states currently she has urinary frequency and dysuria.  This is been going on for 2 days.  She states that she has bad about drinking enough water.  She drinks soda as her primary beverage.  We discussed that this is a poor nutritional choice, and is bad for weight control and developing disease such as diabetes.  In addition she may not be getting enough hydration.  This is evidenced by her urine specific gravity of 1.030 She denies any vaginal discharge.  No abdominal pain.  No flank pain.  No nausea or vomiting.  No fever or chills.  No history of kidney stones or kidney infections  Past Medical History:  Diagnosis Date  . Acid reflux   . Diabetes mellitus without complication (HCC)   . Hypertension     There are no active problems to display for this patient.   Past Surgical History:  Procedure Laterality Date  . ANKLE SURGERY Left   . TUBAL LIGATION    . WRIST SURGERY Left     OB History   No obstetric history on file.      Home Medications    Prior to Admission medications   Medication Sig Start Date End Date Taking? Authorizing Provider  acetaminophen (TYLENOL) 500 MG tablet Take 1 tablet (500 mg total) by mouth every 6 (six) hours as needed. 11/23/18   Fawze, Mina A, PA-C  albuterol (PROVENTIL HFA;VENTOLIN HFA) 108 (90 Base) MCG/ACT inhaler Inhale 1-2 puffs into the lungs every 6 (six) hours as needed for wheezing or shortness of breath. 06/27/18   Wieters, Hallie C, PA-C  Cetirizine HCl 10 MG CAPS Take 1 capsule (10 mg total) by mouth daily for 10 days. 06/27/18 07/07/18  Wieters, Hallie C, PA-C  glipiZIDE (GLUCOTROL) 10 MG tablet Take 10 mg by mouth daily before  breakfast.    [provider]  hydrochlorothiazide (HYDRODIURIL) 25 MG tablet Take 25 mg by mouth daily.    [provider]  ibuprofen (ADVIL,MOTRIN) 600 MG tablet Take 1 tablet (600 mg total) by mouth every 6 (six) hours as needed. 11/23/18   Fawze, Mina A, PA-C  losartan (COZAAR) 50 MG tablet Take 50 mg by mouth daily.    [provider]  nitrofurantoin, macrocrystal-monohydrate, (MACROBID) 100 MG capsule Take 1 capsule (100 mg total) by mouth 2 (two) times daily. 03/26/19   Eustace Moore, MD  omeprazole (PRILOSEC) 20 MG capsule Take 20 mg by mouth daily.    [provider]    Family History History reviewed. No pertinent family history.  Social History Social History   Tobacco Use  . Smoking status: Never Smoker  . Smokeless tobacco: Never Used  Substance Use Topics  . Alcohol use: Yes    Frequency: Never    Comment: Occ  . Drug use: Never     Allergies   Sulfa antibiotics   Review of Systems Review of Systems  Constitutional: Negative for chills and fever.  HENT: Negative for ear pain and sore throat.   Eyes: Negative for pain and visual disturbance.  Respiratory: Negative for cough and shortness of breath.   Cardiovascular:  Negative for chest pain and palpitations.  Gastrointestinal: Negative for abdominal pain and vomiting.  Genitourinary: Positive for dysuria and frequency. Negative for hematuria and vaginal discharge.  Musculoskeletal: Negative for arthralgias and back pain.  Skin: Negative for color change and rash.  Neurological: Negative for seizures and syncope.  All other systems reviewed and are negative.    Physical Exam Triage Vital Signs ED Triage Vitals  Enc Vitals Group     BP 03/26/19 1432 132/79     Pulse Rate 03/26/19 1432 79     Resp 03/26/19 1432 16     Temp 03/26/19 1432 98.3 F (36.8 C)     Temp Source 03/26/19 1432 Oral     SpO2 03/26/19 1432 98 %     Weight --      Height --      Head  Circumference --      Peak Flow --      Pain Score 03/26/19 1434 0     Pain Loc --      Pain Edu? --      Excl. in GC? --    No data found.  Updated Vital Signs BP 132/79 (BP Location: Left Arm)   Pulse 79   Temp 98.3 F (36.8 C) (Oral)   Resp 16   LMP 03/22/2019   SpO2 98%      Physical Exam Constitutional:      General: She is not in acute distress.    Appearance: She is well-developed. She is obese.  HENT:     Head: Normocephalic and atraumatic.  Eyes:     Conjunctiva/sclera: Conjunctivae normal.     Pupils: Pupils are equal, round, and reactive to light.  Neck:     Musculoskeletal: Normal range of motion.  Cardiovascular:     Rate and Rhythm: Normal rate and regular rhythm.  Pulmonary:     Effort: Pulmonary effort is normal. No respiratory distress.     Breath sounds: Normal breath sounds.  Abdominal:     General: There is no distension.     Palpations: Abdomen is soft.     Comments: Abdomen soft and nontender.  No CVA tenderness  Musculoskeletal: Normal range of motion.  Skin:    General: Skin is warm and dry.  Neurological:     Mental Status: She is alert.  Psychiatric:        Mood and Affect: Mood normal.        Behavior: Behavior normal.      UC Treatments / Results  Labs (all labs ordered are listed, but only abnormal results are displayed) Labs Reviewed  POCT URINALYSIS DIP (DEVICE) - Abnormal; Notable for the following components:      Result Value   Leukocytes,Ua MODERATE (*)    All other components within normal limits  URINE CULTURE  URINE CULTURE    EKG None  Radiology No results found.  Procedures Procedures (including critical care time)  Medications Ordered in UC Medications - No data to display  Initial Impression / Assessment and Plan / UC Course  I have reviewed the triage vital signs and the nursing notes.  Pertinent labs & imaging results that were available during my care of the patient were reviewed by me and  considered in my medical decision making (see chart for details).     I reviewed the causes of recurring urinary tract infection.  Fluid intake.  Prevention. Final Clinical Impressions(s) / UC Diagnoses   Final diagnoses:  Lower urinary  tract infectious disease     Discharge Instructions     Antibiotic 2 times a day for 7 days You must try to start drinking more water You will be called if the culture is positive    ED Prescriptions    Medication Sig Dispense Auth. Provider   nitrofurantoin, macrocrystal-monohydrate, (MACROBID) 100 MG capsule Take 1 capsule (100 mg total) by mouth 2 (two) times daily. 14 capsule Eustace Moore, MD     Controlled Substance Prescriptions [place] Controlled Substance Registry consulted? Not Applicable   Eustace Moore, MD 03/26/19 [phone]

## 2019-03-26 NOTE — Discharge Instructions (Addendum)
Antibiotic 2 times a day for 7 days You must try to start drinking more water You will be called if the culture is positive

## 2019-03-26 NOTE — ED Triage Notes (Signed)
Pt present urinary frequency, symptoms started two days ago.  Pt states after she urinate she is having some slightly burning.

## 2019-03-27 LAB — URINE CULTURE: Culture: >=100000 — AB

## 2019-05-03 ENCOUNTER — Encounter (HOSPITAL_COMMUNITY): Payer: Self-pay | Admitting: Emergency Medicine

## 2019-05-03 ENCOUNTER — Ambulatory Visit (HOSPITAL_COMMUNITY)
Admission: EM | Admit: 2019-05-03 | Discharge: 2019-05-03 | Disposition: A | Discharge status: Home / Self Care | Payer: PRIVATE HEALTH INSURANCE | Attending: Emergency Medicine | Admitting: Emergency Medicine

## 2019-05-03 ENCOUNTER — Other Ambulatory Visit: Payer: Self-pay

## 2019-05-03 DIAGNOSIS — N390 Urinary tract infection, site not specified: Secondary | ICD-10-CM | POA: Diagnosis not present

## 2019-05-03 LAB — POCT URINALYSIS DIP (DEVICE)
Bilirubin Urine: NEGATIVE
Glucose, UA: 500 mg/dL — AB
Hgb urine dipstick: NEGATIVE
Leukocytes,Ua: NEGATIVE
Nitrite: POSITIVE — AB
Protein, ur: 30 mg/dL — AB
Specific Gravity, Urine: 1.025 (ref 1.005–1.030)
Urobilinogen, UA: 2 mg/dL — ABNORMAL HIGH (ref 0.0–1.0)
pH: 5 (ref 5.0–8.0)

## 2019-05-03 MED ORDER — CEPHALEXIN 500 MG PO CAPS: 500.0000 mg | ORAL_CAPSULE | Freq: Two times a day (BID) | ORAL | 0 refills | Status: AC

## 2019-05-03 NOTE — ED Provider Notes (Signed)
[facility]    CSN: [id number] Arrival date & time: 05/03/19  1708     History   Chief Complaint Chief Complaint  Patient presents with  . Dysuria    HPI Sally Bender is a 48 y.o. female.   Sally Bender presents with complaints of pain, frequency and burning with urination. Started approximately 2-3 days ago. Mild LLQ pressure but no pelvic pain. No new back pain. No fevers. No gi complaints. Denies vaginal concerns. Was treated for UTI 4/9 and had improved. Has been taking AZO which initially helped but no longer has helped. Hx of dm, htn and reflux.     ROS per HPI, negative if not otherwise mentioned.      Past Medical History:  Diagnosis Date  . Acid reflux   . Diabetes mellitus without complication (HCC)   . Hypertension     There are no active problems to display for this patient.   Past Surgical History:  Procedure Laterality Date  . ANKLE SURGERY Left   . TUBAL LIGATION    . WRIST SURGERY Left     OB History   No obstetric history on file.      Home Medications    Prior to Admission medications   Medication Sig Start Date End Date Taking? Authorizing Provider  glipiZIDE (GLUCOTROL) 10 MG tablet Take 10 mg by mouth daily before breakfast.   Yes [provider]  hydrochlorothiazide (HYDRODIURIL) 25 MG tablet Take 25 mg by mouth daily.   Yes [provider]  losartan (COZAAR) 50 MG tablet Take 50 mg by mouth daily.   Yes [provider]  omeprazole (PRILOSEC) 20 MG capsule Take 20 mg by mouth daily.   Yes [provider]  acetaminophen (TYLENOL) 500 MG tablet Take 1 tablet (500 mg total) by mouth every 6 (six) hours as needed. 11/23/18   Fawze, Mina A, PA-C  albuterol (PROVENTIL HFA;VENTOLIN HFA) 108 (90 Base) MCG/ACT inhaler Inhale 1-2 puffs into the lungs every 6 (six) hours as needed for wheezing or shortness of breath. 06/27/18   Wieters, Hallie C, PA-C  cephALEXin (KEFLEX) 500 MG capsule Take 1  capsule (500 mg total) by mouth 2 (two) times daily for 7 days. 05/03/19 05/10/19  Georgetta Haber, NP  Cetirizine HCl 10 MG CAPS Take 1 capsule (10 mg total) by mouth daily for 10 days. 06/27/18 07/07/18  Wieters, Hallie C, PA-C  ibuprofen (ADVIL,MOTRIN) 600 MG tablet Take 1 tablet (600 mg total) by mouth every 6 (six) hours as needed. 11/23/18   Fawze, Mina A, PA-C  nitrofurantoin, macrocrystal-monohydrate, (MACROBID) 100 MG capsule Take 1 capsule (100 mg total) by mouth 2 (two) times daily. 03/26/19   Eustace Moore, MD    Family History Family History  Problem Relation Age of Onset  . Hypertension Mother   . Diabetes Mother   . Hypertension Father   . Diabetes Father     Social History Social History   Tobacco Use  . Smoking status: Never Smoker  . Smokeless tobacco: Never Used  Substance Use Topics  . Alcohol use: Yes    Frequency: Never    Comment: Occ  . Drug use: Never     Allergies   Sulfa antibiotics   Review of Systems Review of Systems   Physical Exam Triage Vital Signs ED Triage Vitals  Enc Vitals Group     BP      Pulse      Resp  Temp      Temp src      SpO2      Weight      Height      Head Circumference      Peak Flow      Pain Score      Pain Loc      Pain Edu?      Excl. in GC?    No data found.  Updated Vital Signs BP 135/85 (BP Location: Left Arm)   Pulse 83   Temp 98.3 F (36.8 C) (Oral)   Resp 16   SpO2 97%    Physical Exam Constitutional:      General: She is not in acute distress.    Appearance: She is well-developed.  Cardiovascular:     Rate and Rhythm: Normal rate and regular rhythm.     Heart sounds: Normal heart sounds.  Pulmonary:     Effort: Pulmonary effort is normal.     Breath sounds: Normal breath sounds.  Abdominal:     General: Abdomen is flat.     Tenderness: There is no abdominal tenderness. There is no right CVA tenderness, left CVA tenderness, guarding or rebound.  Skin:    General: Skin is  warm and dry.  Neurological:     Mental Status: She is alert and oriented to person, place, and time.      UC Treatments / Results  Labs (all labs ordered are listed, but only abnormal results are displayed) Labs Reviewed  POCT URINALYSIS DIP (DEVICE) - Abnormal; Notable for the following components:      Result Value   Glucose, UA 500 (*)    Ketones, ur TRACE (*)    Protein, ur 30 (*)    Urobilinogen, UA 2.0 (*)    Nitrite POSITIVE (*)    All other components within normal limits  URINE CULTURE    EKG None  Radiology No results found.  Procedures Procedures (including critical care time)  Medications Ordered in UC Medications - No data to display  Initial Impression / Assessment and Plan / UC Course  I have reviewed the triage vital signs and the nursing notes.  Pertinent labs & imaging results that were available during my care of the patient were reviewed by me and considered in my medical decision making (see chart for details).    Nitrite to urine with symptoms of UTI. Keflex provided as took macrobid just last month. Repeat culture. Return precautions provided. If symptoms worsen or do not improve in the next week to return to be seen or to follow up with PCP.  Patient verbalized understanding and agreeable to plan.   Final Clinical Impressions(s) / UC Diagnoses   Final diagnoses:  Lower urinary tract infectious disease     Discharge Instructions     Complete course of antibiotics.  Drink plenty of water to empty bladder regularly. Avoid alcohol and caffeine as these may irritate the bladder.   If symptoms worsen or do not improve in the next week to return to be seen or to follow up with your PCP.      ED Prescriptions    Medication Sig Dispense Auth. Provider   cephALEXin (KEFLEX) 500 MG capsule Take 1 capsule (500 mg total) by mouth 2 (two) times daily for 7 days. 14 capsule Georgetta HaberBurky, Natalie B, NP     Controlled Substance Prescriptions [place]  Controlled Substance Registry consulted? Not Applicable   Georgetta HaberBurky, Natalie B, NP 05/03/19 2037

## 2019-05-03 NOTE — ED Triage Notes (Signed)
uti symptoms, seen by provider

## 2019-05-03 NOTE — Discharge Instructions (Signed)
Complete course of antibiotics.  Drink plenty of water to empty bladder regularly. Avoid alcohol and caffeine as these may irritate the bladder.   If symptoms worsen or do not improve in the next week to return to be seen or to follow up with your PCP.   

## 2019-05-05 ENCOUNTER — Telehealth (HOSPITAL_COMMUNITY): Payer: Self-pay | Admitting: Emergency Medicine

## 2019-05-05 LAB — URINE CULTURE: Culture: >=100000 — AB

## 2019-05-05 NOTE — Telephone Encounter (Signed)
Urine culture was positive for e coli and was given keflex at urgent care visit. Pt contacted and made aware, educated on completing antibiotic and to follow up if symptoms are persistent. Verbalized understanding.    

## 2019-07-21 ENCOUNTER — Emergency Department (HOSPITAL_COMMUNITY)
Admission: EM | Admit: 2019-07-21 | Discharge: 2019-07-21 | Disposition: A | Discharge status: Home / Self Care | Payer: Worker's Compensation | Attending: Emergency Medicine | Admitting: Emergency Medicine

## 2019-07-21 ENCOUNTER — Emergency Department (HOSPITAL_COMMUNITY): Payer: Worker's Compensation

## 2019-07-21 ENCOUNTER — Other Ambulatory Visit: Payer: Self-pay

## 2019-07-21 ENCOUNTER — Encounter (HOSPITAL_COMMUNITY): Payer: Self-pay | Admitting: Emergency Medicine

## 2019-07-21 DIAGNOSIS — I10 Essential (primary) hypertension: Secondary | ICD-10-CM | POA: Insufficient documentation

## 2019-07-21 DIAGNOSIS — S9031XA Contusion of right foot, initial encounter: Secondary | ICD-10-CM | POA: Insufficient documentation

## 2019-07-21 DIAGNOSIS — Y92122 Bedroom in nursing home as the place of occurrence of the external cause: Secondary | ICD-10-CM | POA: Diagnosis not present

## 2019-07-21 DIAGNOSIS — Y93F9 Activity, other caregiving: Secondary | ICD-10-CM | POA: Insufficient documentation

## 2019-07-21 DIAGNOSIS — Z79899 Other long term (current) drug therapy: Secondary | ICD-10-CM | POA: Diagnosis not present

## 2019-07-21 DIAGNOSIS — S99921A Unspecified injury of right foot, initial encounter: Secondary | ICD-10-CM | POA: Diagnosis present

## 2019-07-21 DIAGNOSIS — Y990 Civilian activity done for income or pay: Secondary | ICD-10-CM | POA: Diagnosis not present

## 2019-07-21 DIAGNOSIS — E119 Type 2 diabetes mellitus without complications: Secondary | ICD-10-CM | POA: Insufficient documentation

## 2019-07-21 DIAGNOSIS — W208XXA Other cause of strike by thrown, projected or falling object, initial encounter: Secondary | ICD-10-CM | POA: Diagnosis not present

## 2019-07-21 DIAGNOSIS — Z7984 Long term (current) use of oral hypoglycemic drugs: Secondary | ICD-10-CM | POA: Diagnosis not present

## 2019-07-21 NOTE — Discharge Instructions (Signed)
Rest, ice, elevate the foot for the next 1-2 days Use crutches as needed if pain is severe Take Tylenol or Ibuprofen for pain Please follow up with your doctor

## 2019-07-21 NOTE — ED Notes (Signed)
Patient transported to X-ray 

## 2019-07-21 NOTE — ED Triage Notes (Signed)
Pt. Stated, A wheelchair was propped on a bed and I moved it and the bed fell on my foot right.

## 2019-07-21 NOTE — ED Provider Notes (Signed)
[facility]   CSN: [id number] Arrival date & time: 07/21/19  1103     History   Chief Complaint Chief Complaint  Patient presents with  . Foot Pain  . Foot Injury    HPI Luberta MutterSheila Cloke is a 48 y.o. female who presents with a right foot injury.  Past medical history significant for diabetes, hypertension, GERD.  Patient is a CNA at [employer] and states that she was trying to move a wheelchair to get to the bedside of a resident but the wheelchair was propping up the bed and when she moved the wheelchair the bed fell onto her foot.  She reports acute onset of pain over the top of her foot with associated swelling. Movement of the foot makes it worse.  Rest makes it better. She has had difficulty walking since then and therefore decided to come to the ED.  She denies any knee, ankle, toe pain.  She took a Tylenol and applied ice prior to arrival.       HPI  Past Medical History:  Diagnosis Date  . Acid reflux   . Diabetes mellitus without complication (HCC)   . Hypertension     There are no active problems to display for this patient.   Past Surgical History:  Procedure Laterality Date  . ANKLE SURGERY Left   . TUBAL LIGATION    . WRIST SURGERY Left      OB History   No obstetric history on file.      Home Medications    Prior to Admission medications   Medication Sig Start Date End Date Taking? Authorizing Provider  acetaminophen (TYLENOL) 500 MG tablet Take 1 tablet (500 mg total) by mouth every 6 (six) hours as needed. 11/23/18   Fawze, Mina A, PA-C  albuterol (PROVENTIL HFA;VENTOLIN HFA) 108 (90 Base) MCG/ACT inhaler Inhale 1-2 puffs into the lungs every 6 (six) hours as needed for wheezing or shortness of breath. 06/27/18   Wieters, Hallie C, PA-C  Cetirizine HCl 10 MG CAPS Take 1 capsule (10 mg total) by mouth daily for 10 days. 06/27/18 07/07/18  Wieters, Hallie C, PA-C  glipiZIDE (GLUCOTROL) 10 MG tablet Take  10 mg by mouth daily before breakfast.    [provider]  hydrochlorothiazide (HYDRODIURIL) 25 MG tablet Take 25 mg by mouth daily.    [provider]  ibuprofen (ADVIL,MOTRIN) 600 MG tablet Take 1 tablet (600 mg total) by mouth every 6 (six) hours as needed. 11/23/18   Fawze, Mina A, PA-C  losartan (COZAAR) 50 MG tablet Take 50 mg by mouth daily.    [provider]  nitrofurantoin, macrocrystal-monohydrate, (MACROBID) 100 MG capsule Take 1 capsule (100 mg total) by mouth 2 (two) times daily. 03/26/19   Eustace MooreNelson, Yvonne Sue, MD  omeprazole (PRILOSEC) 20 MG capsule Take 20 mg by mouth daily.    [provider]    Family History Family History  Problem Relation Age of Onset  . Hypertension Mother   . Diabetes Mother   . Hypertension Father   . Diabetes Father     Social History Social History   Tobacco Use  . Smoking status: Never Smoker  . Smokeless tobacco: Never Used  Substance Use Topics  . Alcohol use: Yes    Frequency: Never    Comment: Occ  . Drug use: Never     Allergies   Sulfa antibiotics   Review of Systems Review of Systems  Musculoskeletal:  Positive for arthralgias and joint swelling.  Skin: Negative for wound.  Neurological: Negative for weakness and numbness.     Physical Exam Updated Vital Signs BP (!) 156/91 (BP Location: Right Arm)   Pulse 74   Temp 98.3 F (36.8 C) (Oral)   Resp 16   Ht 5\' 7"  (1.702 m)   Wt 107 kg   LMP 07/15/2019   SpO2 100%   BMI 36.96 kg/m   Physical Exam Vitals signs and nursing note reviewed.  Constitutional:      General: She is not in acute distress.    Appearance: Normal appearance. She is well-developed. She is not ill-appearing.  HENT:     Head: Normocephalic and atraumatic.  Eyes:     General: No scleral icterus.       Right eye: No discharge.        Left eye: No discharge.     Conjunctiva/sclera: Conjunctivae normal.     Pupils: Pupils are equal, round, and reactive to  light.  Neck:     Musculoskeletal: Normal range of motion.  Cardiovascular:     Rate and Rhythm: Normal rate.  Pulmonary:     Effort: Pulmonary effort is normal. No respiratory distress.  Abdominal:     General: There is no distension.  Musculoskeletal:     Comments: Right foot: Minimal swelling and bruising over the dorsal aspect of the foot between the 1st and 2nd metatarsal. No knee, ankle, heel tenderness. Achilles is intact. No tenderness of the toes. N/V intact.   Skin:    General: Skin is warm and dry.  Neurological:     Mental Status: She is alert and oriented to person, place, and time.  Psychiatric:        Behavior: Behavior normal.      ED Treatments / Results  Labs (all labs ordered are listed, but only abnormal results are displayed) Labs Reviewed - No data to display  EKG None  Radiology Dg Foot Complete Right  Result Date: 07/21/2019 CLINICAL DATA:  Structure fell on foot EXAM: RIGHT FOOT COMPLETE - 3+ VIEW COMPARISON:  None. FINDINGS: There is no evidence of fracture or dislocation. Normal bone mineralization seen throughout. Mild dorsal soft tissue swelling. Calcaneal enthesophytes noted. IMPRESSION: No acute osseous injury. Electronically Signed   By: Prudencio Pair M.D.   On: 07/21/2019 11:59    Procedures Procedures (including critical care time)  Medications Ordered in ED Medications - No data to display   Initial Impression / Assessment and Plan / ED Course  I have reviewed the triage vital signs and the nursing notes.  Pertinent labs & imaging results that were available during my care of the patient were reviewed by me and considered in my medical decision making (see chart for details).  48 year old female presents with acute right foot pain after a bed fell on her foot today while at work.  She has mild swelling over the top of her foot but no obvious deformity.  X-rays obtained and shows no acute abnormality.  She was given an Ace wrap.  She  states she has crutches at home to use as needed.  She was given a work note for a couple of days and advised to rest, ice, elevate the leg and take over-the-counter pain medicines as needed.  Final Clinical Impressions(s) / ED Diagnoses   Final diagnoses:  Contusion of right foot, initial encounter    ED Discharge Orders    None  Bethel BornGekas, Aeron Donaghey Marie, PA-C 07/21/19 1452    Tegeler, Canary Brimhristopher J, MD 07/21/19 [phone]

## 2019-10-24 IMAGING — CR DG KNEE COMPLETE 4+V*L*
4 series · 4 of 4 positions shown · non-contrast
Comparison: None.

CLINICAL DATA: Left knee pain

EXAM:
LEFT KNEE - COMPLETE 4+ VIEW

[knee ap]
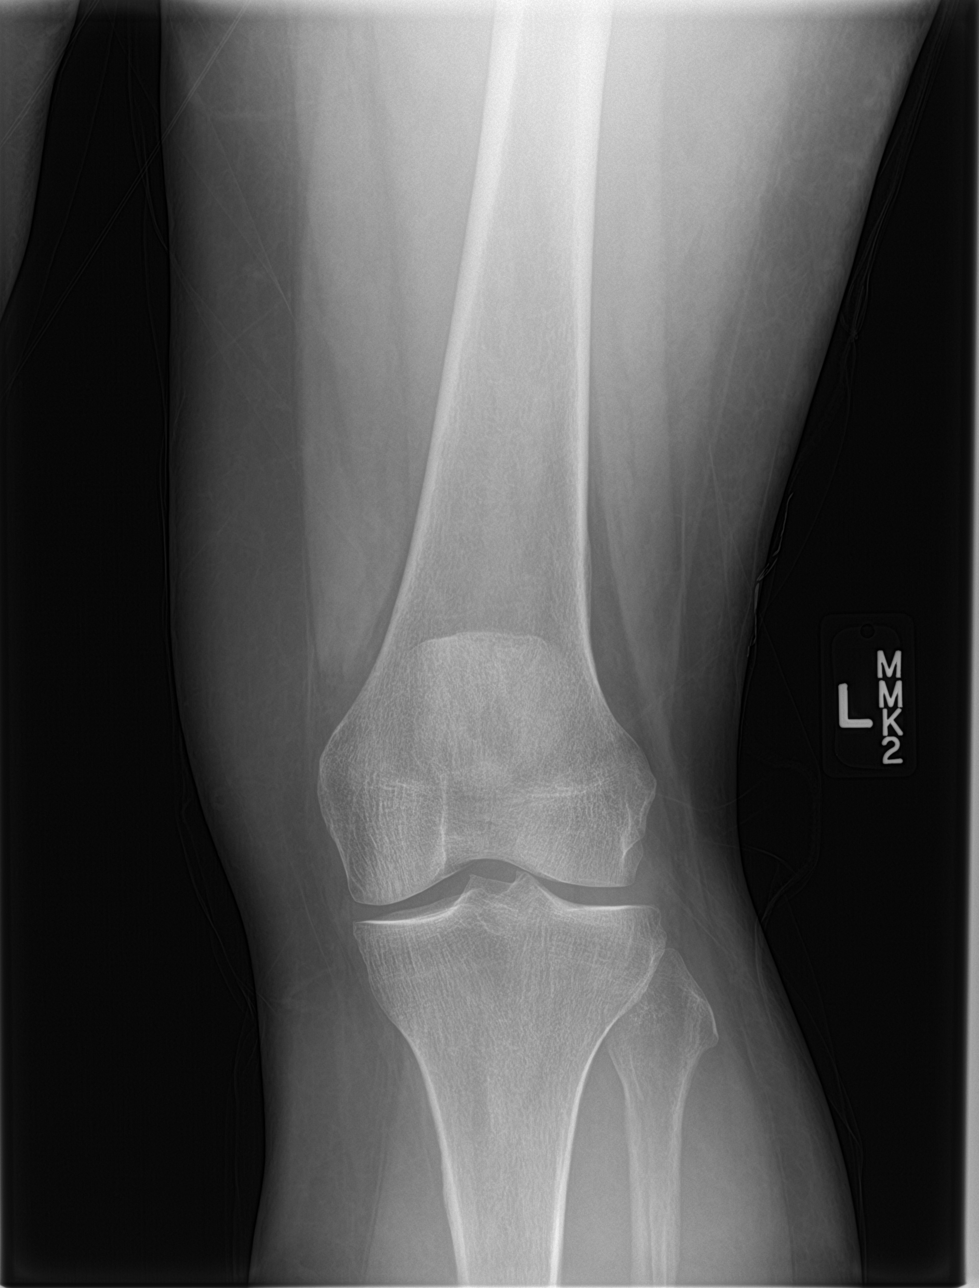

[knee lat]
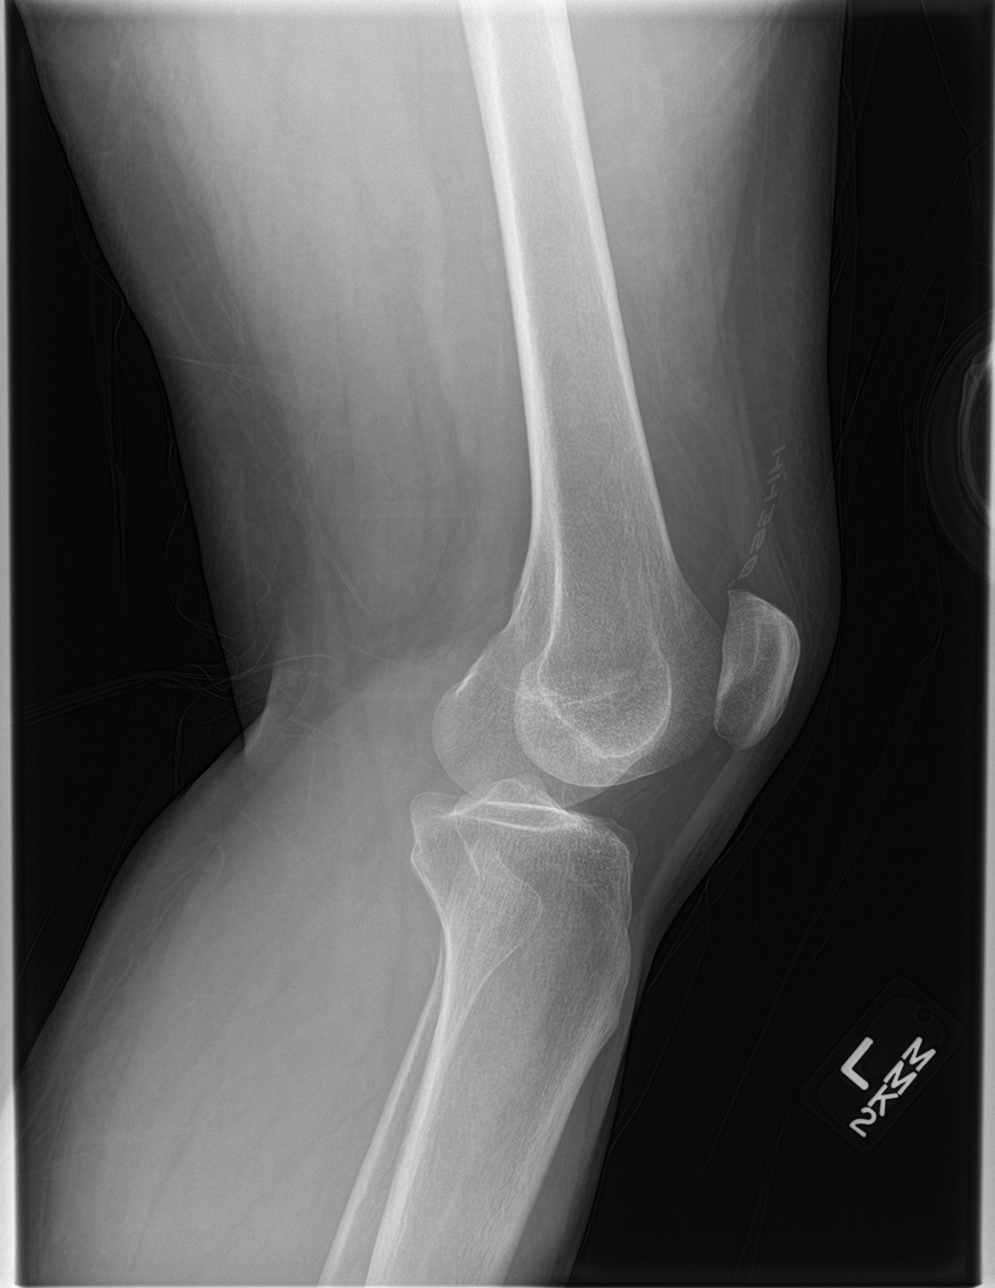

[knee obl (1 of 2)]
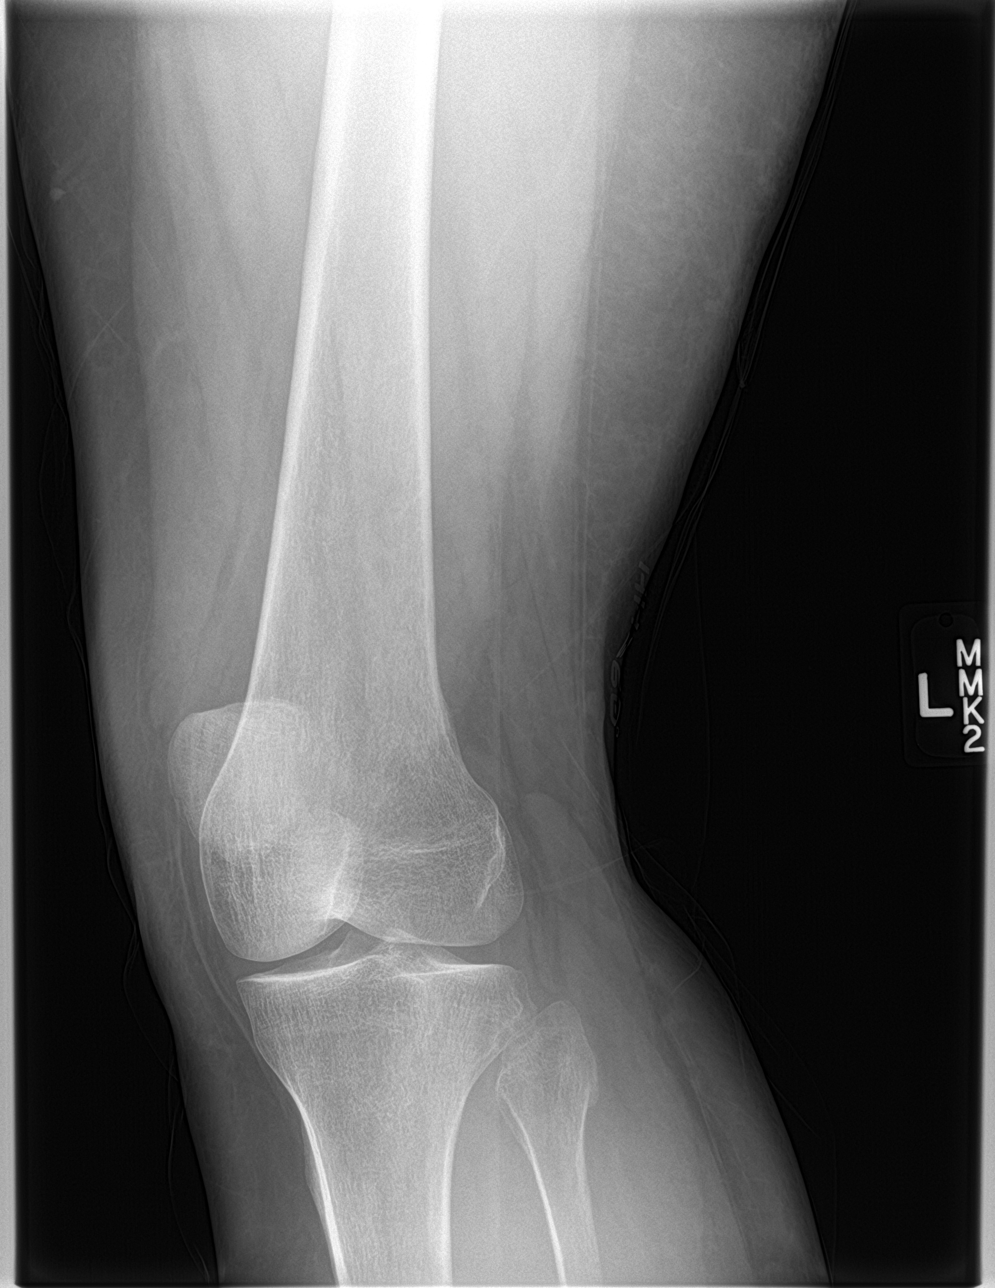

[knee obl (2 of 2)]
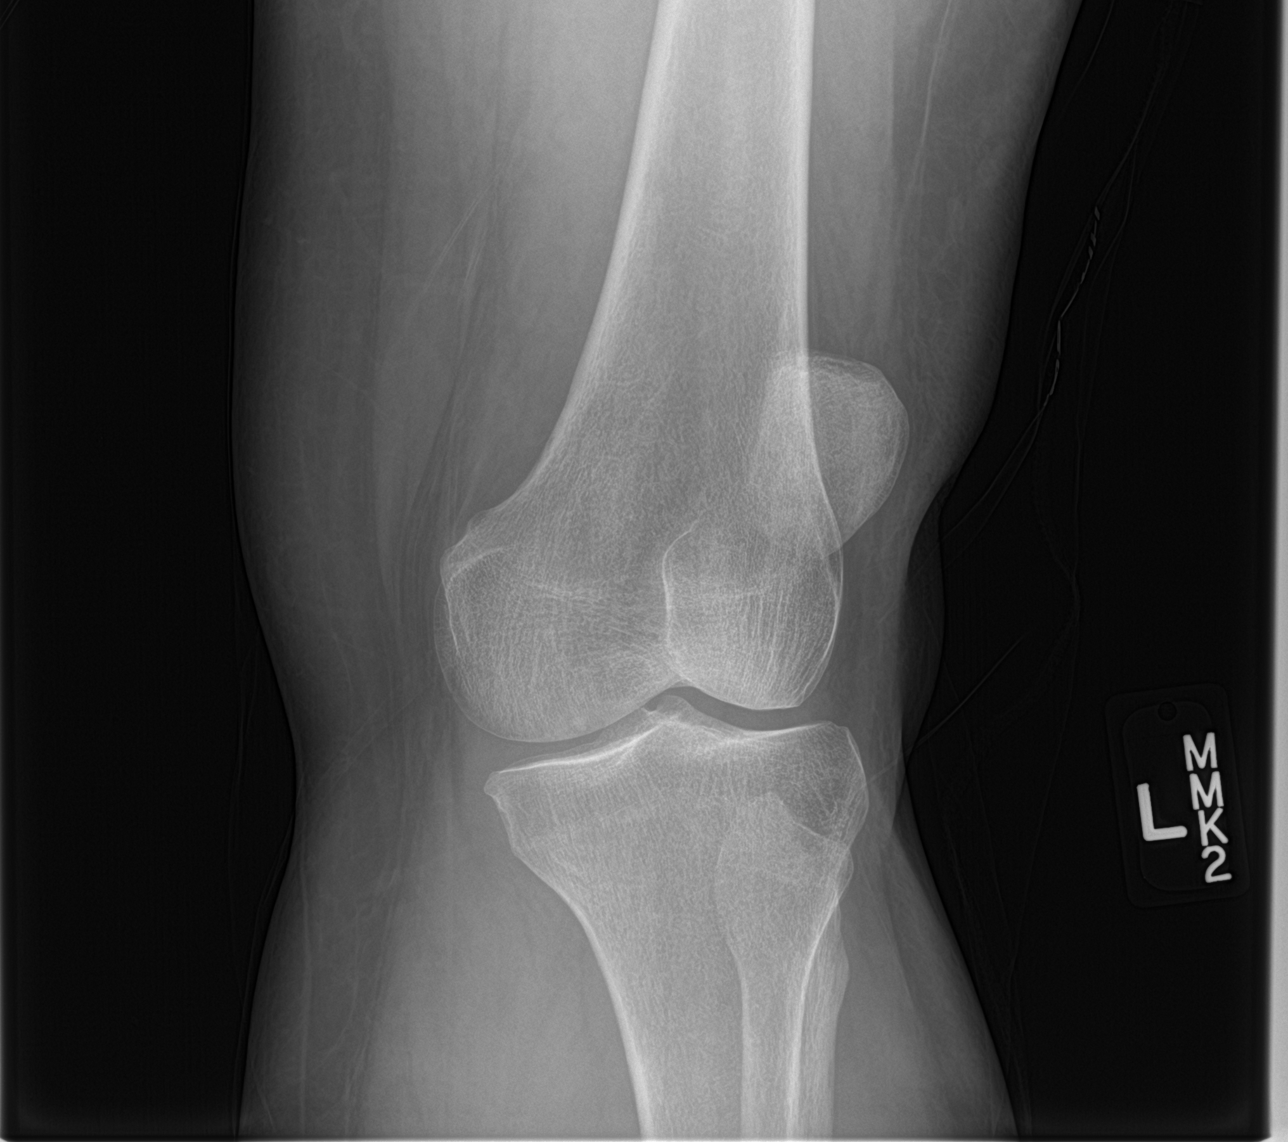

[4 of 4 positions shown; findings below may reference images not displayed]

FINDINGS: No fracture or dislocation is seen.

The joint spaces are preserved.

Visualized soft tissues are within normal limits.

No definite suprapatellar knee joint effusion.
IMPRESSION: Negative.

## 2020-06-20 IMAGING — DX RIGHT FOOT COMPLETE - 3+ VIEW
3 series · 3 of 3 positions shown · non-contrast
Comparison: None.

CLINICAL DATA: Structure fell on foot

EXAM:
RIGHT FOOT COMPLETE - 3+ VIEW

[foot ap]
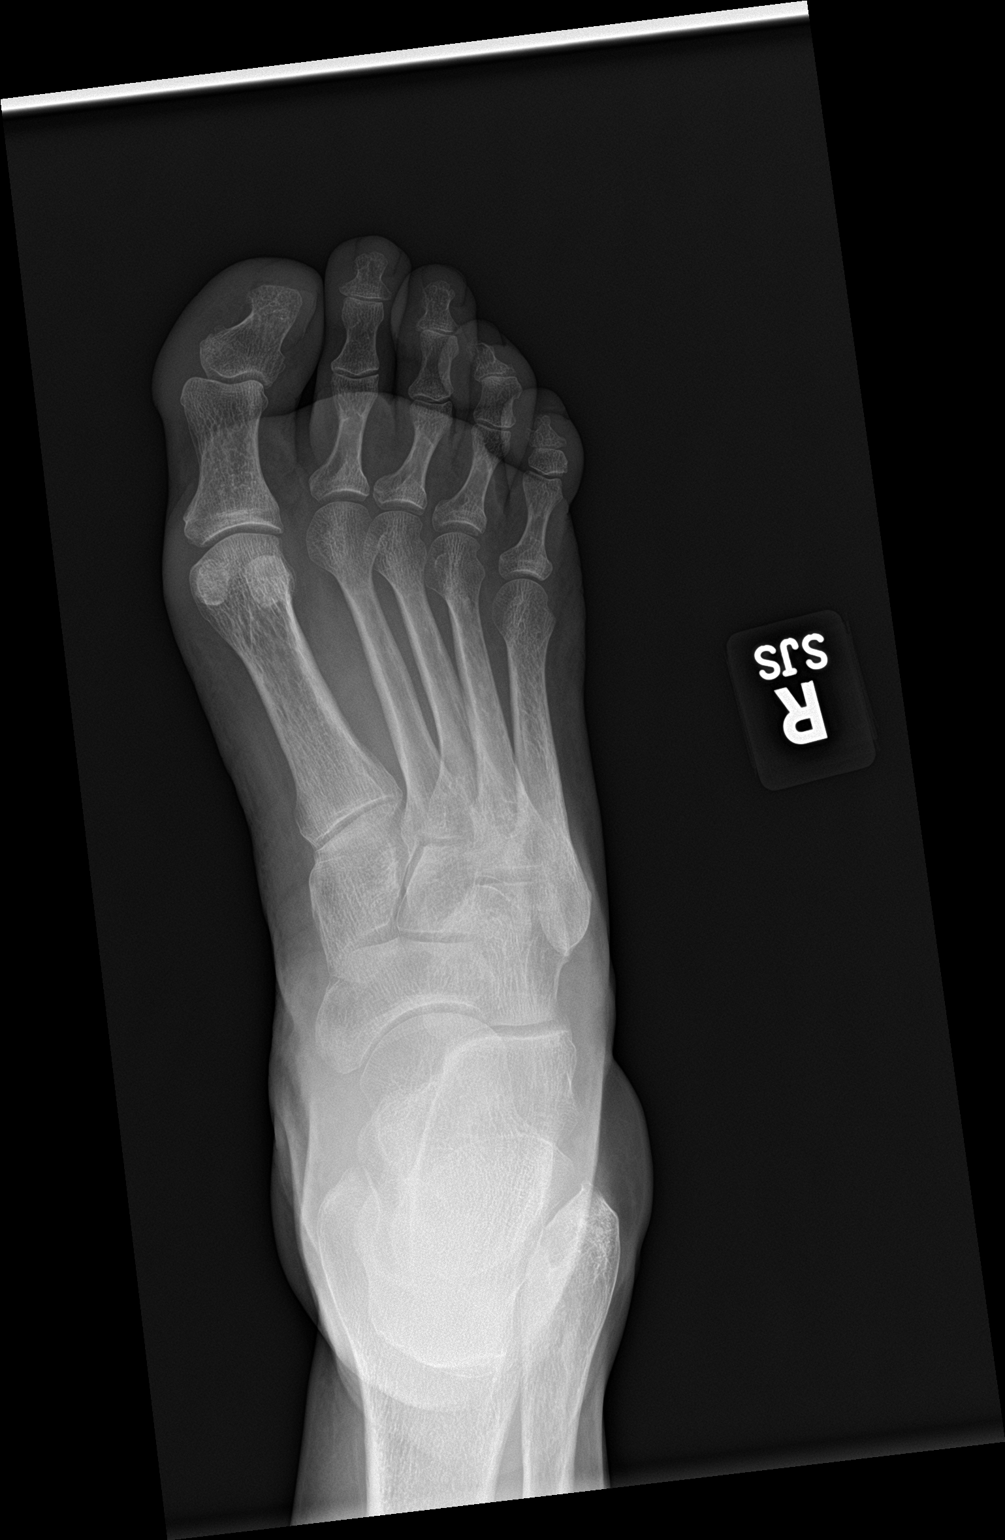

[foot obl]
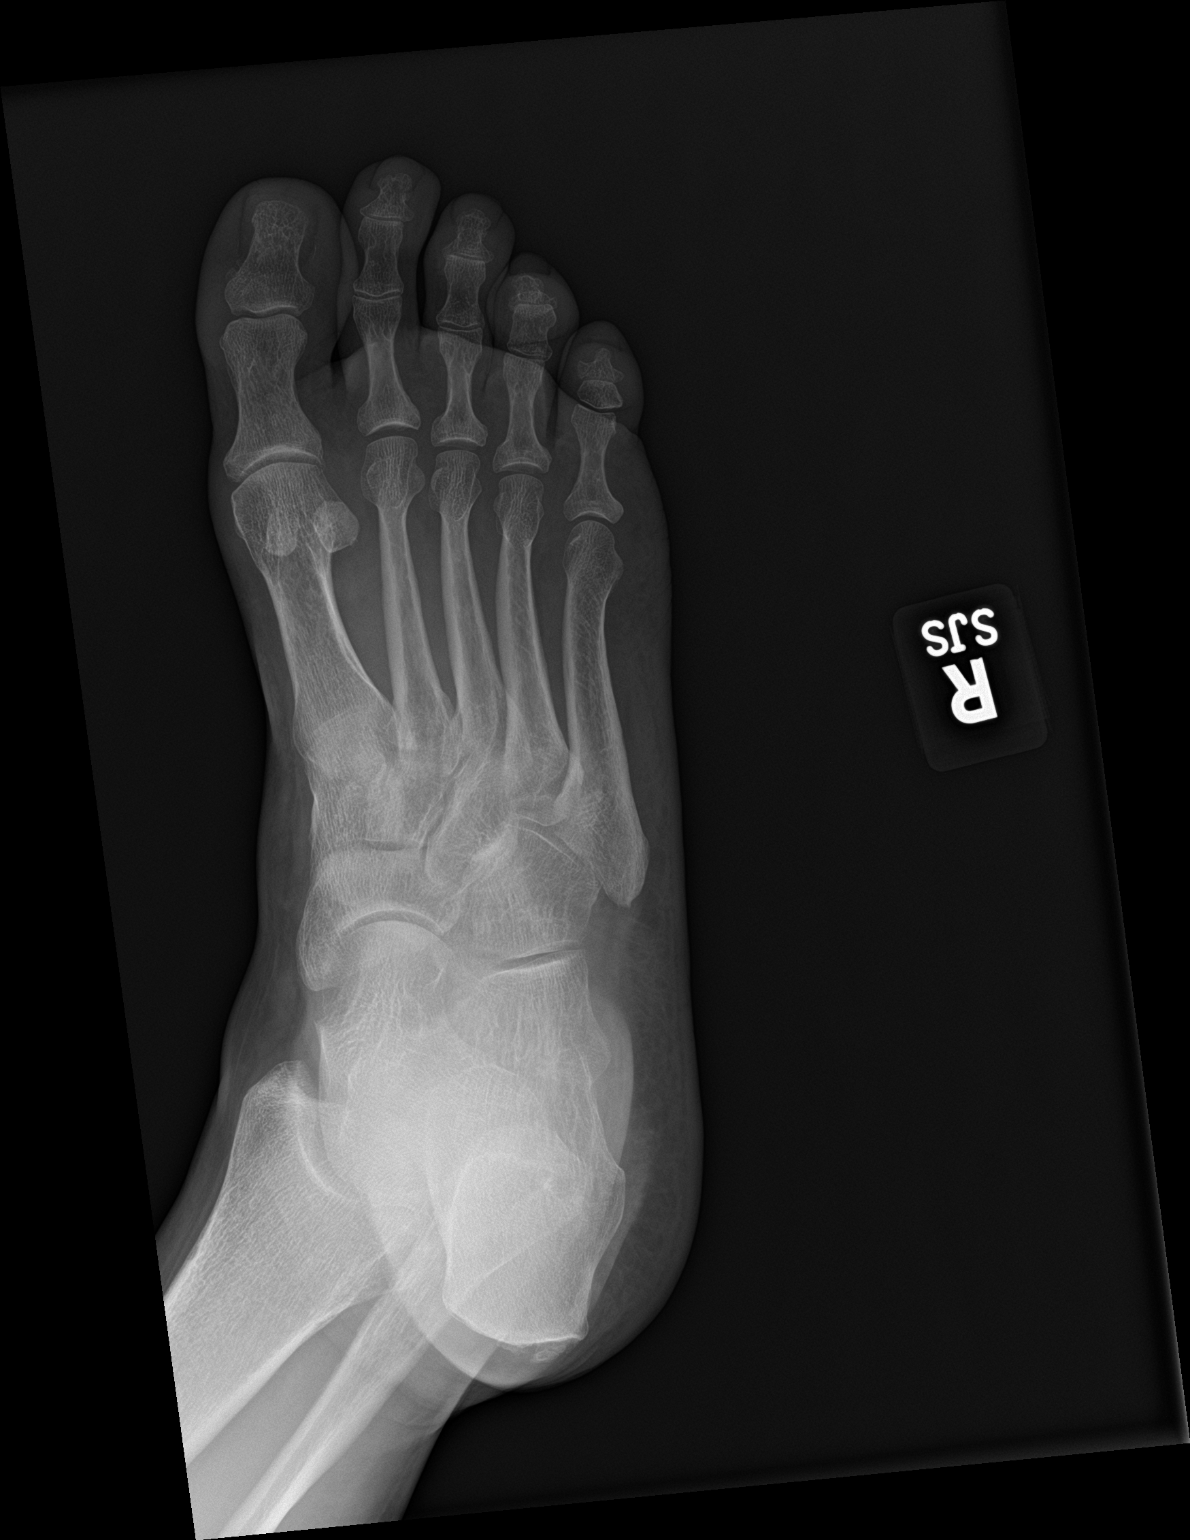

[foot lat]
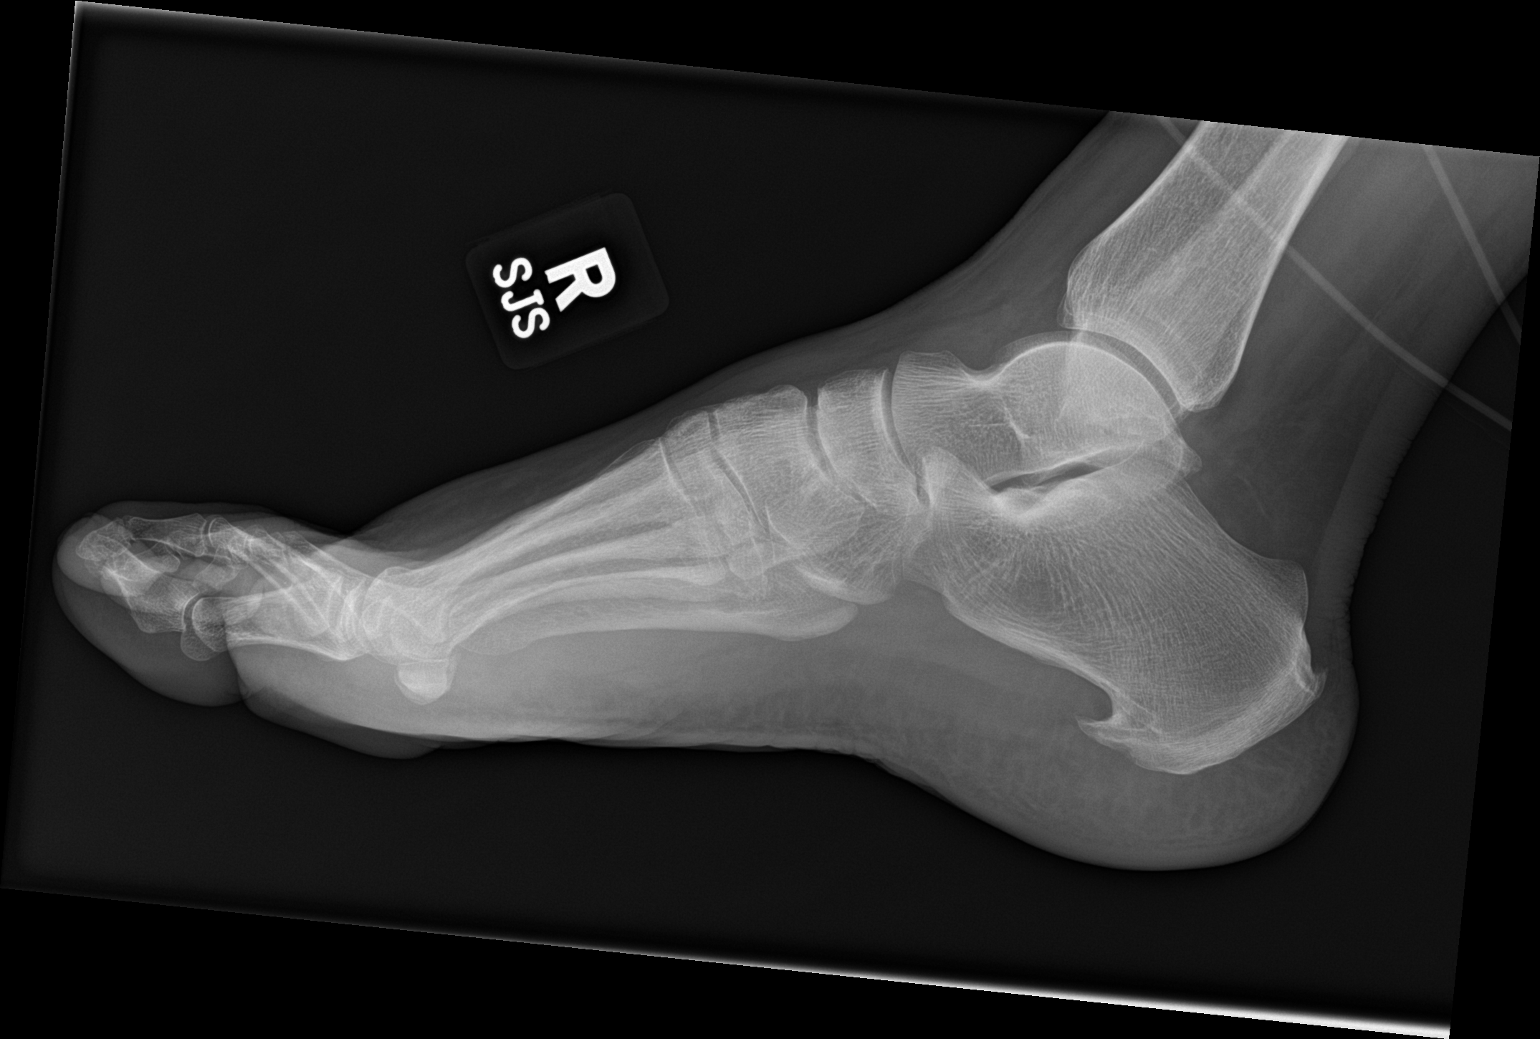

[3 of 3 positions shown; findings below may reference images not displayed]

FINDINGS: There is no evidence of fracture or dislocation. Normal bone
mineralization seen throughout. Mild dorsal soft tissue swelling.
Calcaneal enthesophytes noted.
IMPRESSION: No acute osseous injury.
